# Patient Record
Sex: Male | Born: 1940 | Race: White | Hispanic: Yes | Smoking: Never smoker
Health system: Southern US, Community
[De-identification: ages and names within clinical notes are randomized; demographics above are authoritative.]

## PROBLEM LIST (undated history)

## (undated) DIAGNOSIS — J45909 Unspecified asthma, uncomplicated: Secondary | ICD-10-CM

## (undated) DIAGNOSIS — C801 Malignant (primary) neoplasm, unspecified: Secondary | ICD-10-CM

---

## 2008-12-22 DIAGNOSIS — D469 Myelodysplastic syndrome, unspecified: Secondary | ICD-10-CM | POA: Insufficient documentation

## 2012-06-16 DIAGNOSIS — J329 Chronic sinusitis, unspecified: Secondary | ICD-10-CM | POA: Insufficient documentation

## 2013-07-31 DIAGNOSIS — E291 Testicular hypofunction: Secondary | ICD-10-CM | POA: Insufficient documentation

## 2014-05-06 DIAGNOSIS — R05 Cough: Secondary | ICD-10-CM | POA: Insufficient documentation

## 2014-05-06 DIAGNOSIS — J181 Lobar pneumonia, unspecified organism: Secondary | ICD-10-CM | POA: Insufficient documentation

## 2014-05-06 DIAGNOSIS — R059 Cough, unspecified: Secondary | ICD-10-CM | POA: Insufficient documentation

## 2015-10-26 DIAGNOSIS — R030 Elevated blood-pressure reading, without diagnosis of hypertension: Secondary | ICD-10-CM | POA: Insufficient documentation

## 2016-12-04 DIAGNOSIS — E669 Obesity, unspecified: Secondary | ICD-10-CM | POA: Insufficient documentation

## 2017-03-27 DIAGNOSIS — Z9109 Other allergy status, other than to drugs and biological substances: Secondary | ICD-10-CM | POA: Insufficient documentation

## 2017-03-27 DIAGNOSIS — F5101 Primary insomnia: Secondary | ICD-10-CM | POA: Insufficient documentation

## 2017-03-27 DIAGNOSIS — R7989 Other specified abnormal findings of blood chemistry: Secondary | ICD-10-CM | POA: Insufficient documentation

## 2017-03-27 DIAGNOSIS — J410 Simple chronic bronchitis: Secondary | ICD-10-CM | POA: Insufficient documentation

## 2017-03-27 DIAGNOSIS — N529 Male erectile dysfunction, unspecified: Secondary | ICD-10-CM | POA: Insufficient documentation

## 2017-03-27 DIAGNOSIS — Z7189 Other specified counseling: Secondary | ICD-10-CM | POA: Insufficient documentation

## 2017-06-25 DIAGNOSIS — G252 Other specified forms of tremor: Secondary | ICD-10-CM | POA: Insufficient documentation

## 2017-07-10 DIAGNOSIS — N138 Other obstructive and reflux uropathy: Secondary | ICD-10-CM | POA: Insufficient documentation

## 2017-07-10 DIAGNOSIS — N401 Enlarged prostate with lower urinary tract symptoms: Secondary | ICD-10-CM | POA: Insufficient documentation

## 2017-11-01 DIAGNOSIS — J45909 Unspecified asthma, uncomplicated: Secondary | ICD-10-CM | POA: Insufficient documentation

## 2017-11-01 DIAGNOSIS — Z8669 Personal history of other diseases of the nervous system and sense organs: Secondary | ICD-10-CM | POA: Insufficient documentation

## 2017-11-01 DIAGNOSIS — Z9289 Personal history of other medical treatment: Secondary | ICD-10-CM | POA: Insufficient documentation

## 2018-01-11 DIAGNOSIS — M5440 Lumbago with sciatica, unspecified side: Secondary | ICD-10-CM | POA: Insufficient documentation

## 2018-03-14 DIAGNOSIS — Z9989 Dependence on other enabling machines and devices: Secondary | ICD-10-CM | POA: Insufficient documentation

## 2018-08-29 DIAGNOSIS — M25551 Pain in right hip: Secondary | ICD-10-CM | POA: Insufficient documentation

## 2018-08-29 DIAGNOSIS — M25552 Pain in left hip: Secondary | ICD-10-CM | POA: Insufficient documentation

## 2018-08-29 DIAGNOSIS — Z8619 Personal history of other infectious and parasitic diseases: Secondary | ICD-10-CM | POA: Insufficient documentation

## 2018-09-04 DIAGNOSIS — R5382 Chronic fatigue, unspecified: Secondary | ICD-10-CM | POA: Insufficient documentation

## 2018-09-09 ENCOUNTER — Other Ambulatory Visit (HOSPITAL_COMMUNITY): Payer: Self-pay | Admitting: Pulmonary Disease

## 2018-09-09 ENCOUNTER — Other Ambulatory Visit: Payer: Self-pay | Admitting: Pulmonary Disease

## 2018-09-09 DIAGNOSIS — R0602 Shortness of breath: Secondary | ICD-10-CM

## 2018-10-29 ENCOUNTER — Ambulatory Visit
Admission: RE | Admit: 2018-10-29 | Discharge: 2018-10-29 | Disposition: A | Discharge status: Home / Self Care | Payer: Medicare Other | Source: Ambulatory Visit | Attending: Pulmonary Disease | Admitting: Pulmonary Disease

## 2018-10-29 DIAGNOSIS — R0602 Shortness of breath: Secondary | ICD-10-CM | POA: Diagnosis not present

## 2018-10-29 HISTORY — DX: Malignant (primary) neoplasm, unspecified: C80.1

## 2018-10-29 HISTORY — DX: Unspecified asthma, uncomplicated: J45.909

## 2018-10-29 MED ORDER — IOHEXOL 300 MG/ML  SOLN
75.0000 mL | Freq: Once | INTRAMUSCULAR | Status: AC | PRN
  Administered 2018-10-29: 75 mL via INTRAVENOUS

## 2018-12-08 DIAGNOSIS — B351 Tinea unguium: Secondary | ICD-10-CM | POA: Insufficient documentation

## 2018-12-17 DIAGNOSIS — B009 Herpesviral infection, unspecified: Secondary | ICD-10-CM | POA: Insufficient documentation

## 2019-03-13 ENCOUNTER — Other Ambulatory Visit: Payer: Self-pay | Admitting: Family Medicine

## 2019-03-13 DIAGNOSIS — R1032 Left lower quadrant pain: Secondary | ICD-10-CM

## 2019-03-17 ENCOUNTER — Encounter (INDEPENDENT_AMBULATORY_CARE_PROVIDER_SITE_OTHER): Payer: Self-pay

## 2019-03-17 ENCOUNTER — Ambulatory Visit
Admission: RE | Admit: 2019-03-17 | Discharge: 2019-03-17 | Disposition: A | Discharge status: Home / Self Care | Payer: Medicare Other | Source: Ambulatory Visit | Attending: Family Medicine | Admitting: Family Medicine

## 2019-03-17 ENCOUNTER — Other Ambulatory Visit: Payer: Self-pay

## 2019-03-17 DIAGNOSIS — R1032 Left lower quadrant pain: Secondary | ICD-10-CM | POA: Insufficient documentation

## 2019-03-17 MED ORDER — IOHEXOL 300 MG/ML  SOLN
100.0000 mL | Freq: Once | INTRAMUSCULAR | Status: AC | PRN
  Administered 2019-03-17: 100 mL via INTRAVENOUS

## 2019-03-18 DIAGNOSIS — Z8719 Personal history of other diseases of the digestive system: Secondary | ICD-10-CM | POA: Insufficient documentation

## 2019-04-23 ENCOUNTER — Other Ambulatory Visit: Payer: Self-pay

## 2019-04-24 ENCOUNTER — Encounter: Payer: Self-pay | Admitting: Urology

## 2019-04-24 ENCOUNTER — Telehealth: Payer: Self-pay | Admitting: Urology

## 2019-04-24 ENCOUNTER — Ambulatory Visit (INDEPENDENT_AMBULATORY_CARE_PROVIDER_SITE_OTHER): Payer: Medicare Other | Admitting: Urology

## 2019-04-24 ENCOUNTER — Other Ambulatory Visit: Payer: Self-pay

## 2019-04-24 VITALS — BP 122/82 | HR 96 | Ht 71.0 in | Wt 215.0 lb

## 2019-04-24 DIAGNOSIS — N5201 Erectile dysfunction due to arterial insufficiency: Secondary | ICD-10-CM | POA: Diagnosis not present

## 2019-04-24 DIAGNOSIS — N5082 Scrotal pain: Secondary | ICD-10-CM | POA: Diagnosis not present

## 2019-04-24 DIAGNOSIS — E291 Testicular hypofunction: Secondary | ICD-10-CM | POA: Diagnosis not present

## 2019-04-24 DIAGNOSIS — N401 Enlarged prostate with lower urinary tract symptoms: Secondary | ICD-10-CM | POA: Diagnosis not present

## 2019-04-24 DIAGNOSIS — N5319 Other ejaculatory dysfunction: Secondary | ICD-10-CM

## 2019-04-24 NOTE — Telephone Encounter (Signed)
Dr. Bartolo Darter had his CT done at our [facility] location on 03/17/2019.   FYI  Angie

## 2019-04-24 NOTE — Progress Notes (Signed)
04/24/2019 2:54 PM   Vincent Hines 08/09/1941 [id number]  Referring provider: Dion Body, MD [facility] [place],  [place] [address]  Chief Complaint  Patient presents with  . Erectile Dysfunction  . Testicle Pain    HPI: Vincent Hines is a 78 y.o. male seen at the request of Dr. Netty Starring who presents today wanting to discuss several urologic issues.  He has a 110-month history of left hemiscrotal pain that radiates into the left lower quadrant.  He states he had a "stent" placed in his left vas deferens years ago.  He apparently underwent a testis biopsy for fertility and states that the problem was diagnosed as a side effect of antiseizure medication.  There are no identifiable precipitating, aggravating or alleviating factors to his pain.  Severity is rated mild to moderate.  He recently moved to the area from [place] and states he had "Roto-Rooter" with microwave on his prostate gland for lower urinary tract symptoms.  He noticed improvement in his voiding pattern.  He states for this last 6 months he does not have semen with ejaculation.  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  He is presently divorced and is not sexually active.  He also has hypogonadism and is followed by an endocrinologist at [facility].  He is on testosterone gel.   PMH: Past Medical History:  Diagnosis Date  . Asthma   . 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 INGUINAL HERNIA REPAIR Bilateral 2019  and another one 10 years ago  . prostate surgery 2019   Home Medications:  Allergies as of 04/24/2019      Reactions   Meperidine    Other reaction(s): Other (See Comments), Other (See Comments), Other (See Comments) Hypotension "irregular HR" reported by pt Low BP Hypotension Low BP   Shellfish Allergy Shortness Of Breath   Cerner Allergy Text Annotation: Shrimp "throat closing" per pt   Sulfamethoxazole-trimethoprim Anaphylaxis   Azithromycin    Other reaction(s): Dizziness      Medication List       Accurate as of April 24, 2019  2:54 PM. If you have any questions, ask your nurse or doctor.        albuterol (2.5 MG/3ML) 0.083% nebulizer solution Commonly known as: PROVENTIL VVN QID   bisacodyl 10 MG suppository Commonly known as: DULCOLAX   ciprofloxacin 500 MG tablet Commonly known as: CIPRO   fluticasone 50 MCG/ACT nasal spray Commonly known as: FLONASE SHAKE LQ AND U 1 SPR IEN QD   INSULIN SYRINGE .3CC/31GX5/16" 31G X 5/16" 0.3 ML Misc Use as needed with Trimix up to 3 times per week.   ipratropium 0.06 % nasal spray Commonly known as: ATROVENT U 2 SPRAYS IEN BID   metroNIDAZOLE 500 MG tablet Commonly known as: FLAGYL   montelukast 10 MG tablet Commonly known as: SINGULAIR montelukast 10 mg tablet  TAKE 1 TABLET BY MOUTH EVERY DAY   naproxen 500 MG tablet Commonly known as: NAPROSYN TK 1 T PO BID WC   Sodium Chloride (Inhalant) 7 % Nebu Inhale into the lungs.   tadalafil 10 MG tablet Commonly known as: CIALIS tadalafil 10 mg tablet  TK 1 T PO QD PRF SEXUAL ACTIVITY   terbinafine 250 MG tablet Commonly known as: LAMISIL TAKE 1 TABLET BY MOUTH ONCE DAILY FOR FUNGAL TOENAILS   Testosterone 12.5 MG/ACT (1%) Gel  Apply topically.   Tiotropium Bromide Monohydrate 2.5 MCG/ACT Aers Inhale into the lungs.   tobramycin (PF) 300 MG/5ML nebulizer solution Commonly known as: TOBI INHALE THE CONTENTS OF 1 AMPULE VIA NEBULIZER TWICE DAILY FOR 28 DAYS   traZODone 50 MG tablet Commonly known as: DESYREL   valACYclovir 1000 MG tablet Commonly known as: VALTREX   Viagra 100 MG tablet Generic drug: sildenafil Viagra 100 mg tablet       Allergies:  Allergies  Allergen Reactions  . Meperidine     Other reaction(s): Other (See Comments), Other (See Comments), Other (See Comments) Hypotension "irregular HR" reported by pt Low BP  Hypotension Low BP   . Shellfish Allergy Shortness Of Breath    Cerner Allergy Text Annotation: Shrimp "throat closing" per pt   . Sulfamethoxazole-Trimethoprim Anaphylaxis  . Azithromycin     Other reaction(s): Dizziness    Family History: No family history on file.  Social History:  reports that he has never smoked. He does not have any smokeless tobacco history on file. He reports current alcohol use. He reports that he does not use drugs.  ROS: UROLOGY Frequent Urination?: No Hard to postpone urination?: Yes Burning/pain with urination?: No Get up at night to urinate?: Yes Leakage of urine?: No Urine stream starts and stops?: No Trouble starting stream?: No Do you have to strain to urinate?: No Blood in urine?: No Urinary tract infection?: No Sexually transmitted disease?: No Injury to kidneys or bladder?: No Painful intercourse?: No Weak stream?: No Erection problems?: No Penile pain?: No  Gastrointestinal Nausea?: No Vomiting?: No Indigestion/heartburn?: No Diarrhea?: No Constipation?: No  Constitutional Fever: No Night sweats?: No Weight loss?: No Fatigue?: No  Skin Skin rash/lesions?: No Itching?: No  Eyes Blurred vision?: No Double vision?: No  Ears/Nose/Throat Sore throat?: No Sinus problems?: No  Hematologic/Lymphatic Swollen glands?: No Easy bruising?: No  Cardiovascular Leg swelling?: No Chest pain?: No  Respiratory Cough?: No Shortness of breath?: No  Endocrine Excessive thirst?: No  Musculoskeletal Back pain?: No Joint pain?: No  Neurological Headaches?: No Dizziness?: No  Psychologic Depression?: No Anxiety?: No  Physical Exam: BP 122/82 (BP Location: Left Arm, Patient Position: Sitting, Cuff Size: Normal)   Pulse 96   Ht 5\' 11"  (1.803 m)   Wt 215 lb (97.5 kg)   BMI 29.99 kg/m   Constitutional:  Alert and oriented, No acute distress. HEENT: [place] AT, moist mucus membranes.  Trachea midline, no masses.  Cardiovascular: No clubbing, cyanosis, or edema. Respiratory: Normal respiratory effort, no increased work of breathing. GI: Abdomen is soft, nontender, nondistended, no abdominal masses GU: Penis without lesions, testes descended bilaterally.  Soft and atrophic bilaterally without masses or tenderness.  No palpable inguinal hernia no inguinal canal tenderness. Lymph: No cervical or inguinal lymphadenopathy. Skin: No rashes, bruises or suspicious lesions. Neurologic: Grossly intact, no focal deficits, moving all 4 extremities. Psychiatric: Normal mood and affect.   Pertinent Imaging: CT abdomen pelvis July 2020 was reviewed  Assessment & Plan:    - Left scrotal content pain No significant abnormalities on exam.  Will order a scrotal ultrasound.  I did discuss that chronic scrotal pain can be difficult to diagnose an etiology and treatment can also be difficult.  He did have a CT of the abdomen and pelvis in July 2020.  There was a nonobstructing left renal calculus, a small renal cyst.  No significant pelvic abnormalities were identified.  -BPH with lower urinary tract symptoms We will obtain his prior urology  records for review.  His loss of ejaculation could be retrograde secondary to the prostate procedure.  I did discuss that this particular problem may not be able to be resolved.  - Erectile dysfunction Currently not sexually active.  He is on Trimix  - Hypogonadism Followed by endocrinology at [facility], [place] [address] [place], [place] [address] [phone]

## 2019-04-27 ENCOUNTER — Encounter: Payer: Self-pay | Admitting: Urology

## 2019-05-25 ENCOUNTER — Other Ambulatory Visit: Payer: Self-pay

## 2019-05-25 DIAGNOSIS — Z20822 Contact with and (suspected) exposure to covid-19: Secondary | ICD-10-CM

## 2019-05-27 LAB — NOVEL CORONAVIRUS, NAA: SARS-CoV-2, NAA: NOT DETECTED

## 2019-05-29 ENCOUNTER — Ambulatory Visit
Admission: RE | Admit: 2019-05-29 | Discharge: 2019-05-29 | Disposition: A | Discharge status: Home / Self Care | Payer: Medicare Other | Source: Ambulatory Visit | Attending: Urology | Admitting: Urology

## 2019-05-29 ENCOUNTER — Other Ambulatory Visit: Payer: Self-pay

## 2019-05-29 DIAGNOSIS — N5082 Scrotal pain: Secondary | ICD-10-CM

## 2019-06-01 ENCOUNTER — Telehealth: Payer: Self-pay

## 2019-06-01 NOTE — Telephone Encounter (Signed)
Called pt, informed him of the information below. Pt gave verbal understanding. He wanted me to make provider aware that he is still having the scrotal pain intermittently. Will forward to provider.

## 2019-06-01 NOTE — Telephone Encounter (Signed)
-----   Message from Abbie Sons, MD sent at 05/31/2019 12:57 PM EDT ----- Scrotal ultrasound showed no abnormalities.  No abnormalities identified that would be a cause of his scrotal pain.

## 2019-06-02 NOTE — Telephone Encounter (Signed)
Unfortunately scrotal pain can be a difficult problem to treat since in most cases the etiology is unknown.  Can see him back as needed for increasing pain.

## 2019-06-02 NOTE — Telephone Encounter (Signed)
Called pt informed him of the information below. Pt gave verbal understanding.  

## 2020-03-11 ENCOUNTER — Other Ambulatory Visit (HOSPITAL_COMMUNITY): Payer: Self-pay | Admitting: Pulmonary Disease

## 2020-03-11 ENCOUNTER — Other Ambulatory Visit: Payer: Self-pay | Admitting: Pulmonary Disease

## 2020-03-11 DIAGNOSIS — J471 Bronchiectasis with (acute) exacerbation: Secondary | ICD-10-CM

## 2020-04-01 ENCOUNTER — Telehealth: Payer: Self-pay

## 2020-04-01 DIAGNOSIS — N5201 Erectile dysfunction due to arterial insufficiency: Secondary | ICD-10-CM

## 2020-04-01 MED ORDER — NONFORMULARY OR COMPOUNDED ITEM: 6 refills | Status: AC

## 2020-04-01 NOTE — Telephone Encounter (Signed)
Can send in the lowest strength Trimix that [facility] has.  Start at 0.1 cc and can titrate as needed 0.05-0.1 cc to achieve an erection lasting 30-60 minutes.  Call or proceed to ED for erection lasting longer than 3 hours

## 2020-04-01 NOTE — Telephone Encounter (Signed)
Tri-mix comes in different concentrations.  Will need another concentration he was using.  Does he have any prior records or the pharmacy he was getting the Trimix from?

## 2020-04-01 NOTE — Telephone Encounter (Signed)
RX sent, pt informed

## 2020-04-01 NOTE — Telephone Encounter (Signed)
Dr. Bartolo Darter calls and states that his rx for Trimix is expired. He would like a new rx sent in. I do not see that we have ever sent this medication in for him, I called [facility] and they confirm that they have never filled Trimix for this pt. Please advise.

## 2020-04-01 NOTE — Telephone Encounter (Signed)
Called pt he does not remember what pharmacy he previously got medication at or the dose. He does remember that he titrated up by 0.05 to 0.10 as needed. Please advise.

## 2020-05-04 ENCOUNTER — Ambulatory Visit: Admission: RE | Admit: 2020-05-04 | Payer: Medicare Other | Source: Ambulatory Visit

## 2020-05-11 ENCOUNTER — Other Ambulatory Visit: Payer: Self-pay

## 2020-05-11 ENCOUNTER — Ambulatory Visit
Admission: RE | Admit: 2020-05-11 | Discharge: 2020-05-11 | Disposition: A | Discharge status: Home / Self Care | Payer: Medicare Other | Source: Ambulatory Visit | Attending: Pulmonary Disease | Admitting: Pulmonary Disease

## 2020-05-11 DIAGNOSIS — J471 Bronchiectasis with (acute) exacerbation: Secondary | ICD-10-CM | POA: Insufficient documentation

## 2020-06-21 ENCOUNTER — Other Ambulatory Visit: Payer: Self-pay | Admitting: Family Medicine

## 2020-06-21 ENCOUNTER — Other Ambulatory Visit (HOSPITAL_COMMUNITY): Payer: Self-pay | Admitting: Family Medicine

## 2020-06-21 DIAGNOSIS — M5440 Lumbago with sciatica, unspecified side: Secondary | ICD-10-CM

## 2020-07-06 ENCOUNTER — Ambulatory Visit
Admission: RE | Admit: 2020-07-06 | Discharge: 2020-07-06 | Disposition: A | Discharge status: Home / Self Care | Payer: Medicare Other | Source: Ambulatory Visit | Attending: Family Medicine | Admitting: Family Medicine

## 2020-07-06 ENCOUNTER — Other Ambulatory Visit: Payer: Self-pay

## 2020-07-06 DIAGNOSIS — M5440 Lumbago with sciatica, unspecified side: Secondary | ICD-10-CM | POA: Insufficient documentation

## 2020-11-17 ENCOUNTER — Other Ambulatory Visit: Payer: Self-pay | Admitting: Family Medicine

## 2020-11-17 ENCOUNTER — Other Ambulatory Visit (HOSPITAL_COMMUNITY): Payer: Self-pay | Admitting: Family Medicine

## 2020-11-17 DIAGNOSIS — R251 Tremor, unspecified: Secondary | ICD-10-CM

## 2020-11-17 DIAGNOSIS — R0789 Other chest pain: Secondary | ICD-10-CM

## 2020-11-17 DIAGNOSIS — R4781 Slurred speech: Secondary | ICD-10-CM

## 2020-11-30 ENCOUNTER — Other Ambulatory Visit: Payer: Self-pay

## 2020-11-30 ENCOUNTER — Ambulatory Visit
Admission: RE | Admit: 2020-11-30 | Discharge: 2020-11-30 | Disposition: A | Discharge status: Home / Self Care | Payer: Medicare Other | Source: Ambulatory Visit | Attending: Family Medicine | Admitting: Family Medicine

## 2020-11-30 DIAGNOSIS — R4781 Slurred speech: Secondary | ICD-10-CM | POA: Insufficient documentation

## 2020-11-30 DIAGNOSIS — R0789 Other chest pain: Secondary | ICD-10-CM | POA: Diagnosis present

## 2020-11-30 DIAGNOSIS — R251 Tremor, unspecified: Secondary | ICD-10-CM | POA: Insufficient documentation

## 2022-01-24 ENCOUNTER — Emergency Department: Payer: Medicare Other

## 2022-01-24 ENCOUNTER — Inpatient Hospital Stay
Admission: EM | Admit: 2022-01-24 | Discharge: 2022-01-26 | DRG: 178 | Disposition: A | Discharge status: Home / Self Care | Payer: Medicare Other | Attending: Internal Medicine | Admitting: Internal Medicine

## 2022-01-24 ENCOUNTER — Other Ambulatory Visit: Payer: Self-pay

## 2022-01-24 DIAGNOSIS — J690 Pneumonitis due to inhalation of food and vomit: Principal | ICD-10-CM | POA: Diagnosis present

## 2022-01-24 DIAGNOSIS — T446X5A Adverse effect of alpha-adrenoreceptor antagonists, initial encounter: Secondary | ICD-10-CM | POA: Diagnosis present

## 2022-01-24 DIAGNOSIS — N1831 Chronic kidney disease, stage 3a: Secondary | ICD-10-CM | POA: Diagnosis present

## 2022-01-24 DIAGNOSIS — R55 Syncope and collapse: Secondary | ICD-10-CM | POA: Diagnosis not present

## 2022-01-24 DIAGNOSIS — B009 Herpesviral infection, unspecified: Secondary | ICD-10-CM | POA: Diagnosis present

## 2022-01-24 DIAGNOSIS — J4520 Mild intermittent asthma, uncomplicated: Secondary | ICD-10-CM

## 2022-01-24 DIAGNOSIS — I5042 Chronic combined systolic (congestive) and diastolic (congestive) heart failure: Secondary | ICD-10-CM | POA: Diagnosis present

## 2022-01-24 DIAGNOSIS — D6959 Other secondary thrombocytopenia: Secondary | ICD-10-CM | POA: Diagnosis present

## 2022-01-24 DIAGNOSIS — D696 Thrombocytopenia, unspecified: Secondary | ICD-10-CM | POA: Diagnosis present

## 2022-01-24 DIAGNOSIS — Z20822 Contact with and (suspected) exposure to covid-19: Secondary | ICD-10-CM | POA: Diagnosis present

## 2022-01-24 DIAGNOSIS — J470 Bronchiectasis with acute lower respiratory infection: Secondary | ICD-10-CM | POA: Diagnosis present

## 2022-01-24 DIAGNOSIS — Z91013 Allergy to seafood: Secondary | ICD-10-CM

## 2022-01-24 DIAGNOSIS — D469 Myelodysplastic syndrome, unspecified: Secondary | ICD-10-CM | POA: Diagnosis present

## 2022-01-24 DIAGNOSIS — J9601 Acute respiratory failure with hypoxia: Secondary | ICD-10-CM

## 2022-01-24 DIAGNOSIS — E1142 Type 2 diabetes mellitus with diabetic polyneuropathy: Secondary | ICD-10-CM | POA: Diagnosis present

## 2022-01-24 DIAGNOSIS — C9111 Chronic lymphocytic leukemia of B-cell type in remission: Secondary | ICD-10-CM | POA: Diagnosis present

## 2022-01-24 DIAGNOSIS — N138 Other obstructive and reflux uropathy: Secondary | ICD-10-CM | POA: Diagnosis present

## 2022-01-24 DIAGNOSIS — N179 Acute kidney failure, unspecified: Secondary | ICD-10-CM | POA: Diagnosis not present

## 2022-01-24 DIAGNOSIS — Z79899 Other long term (current) drug therapy: Secondary | ICD-10-CM

## 2022-01-24 DIAGNOSIS — W19XXXA Unspecified fall, initial encounter: Secondary | ICD-10-CM

## 2022-01-24 DIAGNOSIS — I5032 Chronic diastolic (congestive) heart failure: Secondary | ICD-10-CM | POA: Diagnosis present

## 2022-01-24 DIAGNOSIS — J479 Bronchiectasis, uncomplicated: Secondary | ICD-10-CM

## 2022-01-24 DIAGNOSIS — J45909 Unspecified asthma, uncomplicated: Secondary | ICD-10-CM | POA: Diagnosis present

## 2022-01-24 DIAGNOSIS — E860 Dehydration: Secondary | ICD-10-CM | POA: Diagnosis present

## 2022-01-24 DIAGNOSIS — Z794 Long term (current) use of insulin: Secondary | ICD-10-CM

## 2022-01-24 DIAGNOSIS — Y92009 Unspecified place in unspecified non-institutional (private) residence as the place of occurrence of the external cause: Secondary | ICD-10-CM

## 2022-01-24 DIAGNOSIS — N401 Enlarged prostate with lower urinary tract symptoms: Secondary | ICD-10-CM | POA: Diagnosis present

## 2022-01-24 DIAGNOSIS — Z7951 Long term (current) use of inhaled steroids: Secondary | ICD-10-CM

## 2022-01-24 LAB — CBC WITH DIFFERENTIAL/PLATELET
Abs Immature Granulocytes: 0.03 10*3/uL (ref 0.00–0.07)
Basophils Absolute: 0 10*3/uL (ref 0.0–0.1)
Basophils Relative: 0 %
Eosinophils Absolute: 0.1 10*3/uL (ref 0.0–0.5)
Eosinophils Relative: 1 %
HCT: 43 % (ref 39.0–52.0)
Hemoglobin: 14.1 g/dL (ref 13.0–17.0)
Immature Granulocytes: 0 %
Lymphocytes Relative: 14 %
Lymphs Abs: 1.1 10*3/uL (ref 0.7–4.0)
MCH: 32.3 pg (ref 26.0–34.0)
MCHC: 32.8 g/dL (ref 30.0–36.0)
MCV: 98.6 fL (ref 80.0–100.0)
Monocytes Absolute: 0.3 10*3/uL (ref 0.1–1.0)
Monocytes Relative: 3 %
Neutro Abs: 6.8 10*3/uL (ref 1.7–7.7)
Neutrophils Relative %: 82 %
Platelets: 133 10*3/uL — ABNORMAL LOW (ref 150–400)
RBC: 4.36 MIL/uL (ref 4.22–5.81)
RDW: 13.4 % (ref 11.5–15.5)
WBC: 8.3 10*3/uL (ref 4.0–10.5)
nRBC: 0 % (ref 0.0–0.2)

## 2022-01-24 LAB — URINALYSIS, COMPLETE (UACMP) WITH MICROSCOPIC
Bacteria, UA: NONE SEEN
Bilirubin Urine: NEGATIVE
Glucose, UA: NEGATIVE mg/dL
Hgb urine dipstick: NEGATIVE
Ketones, ur: NEGATIVE mg/dL
Leukocytes,Ua: NEGATIVE
Nitrite: NEGATIVE
Protein, ur: NEGATIVE mg/dL
Specific Gravity, Urine: 1.038 — ABNORMAL HIGH (ref 1.005–1.030)
Squamous Epithelial / HPF: NONE SEEN (ref 0–5)
pH: 5 (ref 5.0–8.0)

## 2022-01-24 LAB — BASIC METABOLIC PANEL
Anion gap: 6 (ref 5–15)
BUN: 21 mg/dL (ref 8–23)
CO2: 30 mmol/L (ref 22–32)
Calcium: 9.2 mg/dL (ref 8.9–10.3)
Chloride: 103 mmol/L (ref 98–111)
Creatinine, Ser: 1.5 mg/dL — ABNORMAL HIGH (ref 0.61–1.24)
GFR, Estimated: 46 mL/min — ABNORMAL LOW (ref >60)
Glucose, Bld: 161 mg/dL — ABNORMAL HIGH (ref 70–99)
Potassium: 3.6 mmol/L (ref 3.5–5.1)
Sodium: 139 mmol/L (ref 135–145)

## 2022-01-24 LAB — BRAIN NATRIURETIC PEPTIDE: B Natriuretic Peptide: 21 pg/mL (ref 0.0–100.0)

## 2022-01-24 LAB — TROPONIN I (HIGH SENSITIVITY)
Troponin I (High Sensitivity): 9 ng/L (ref <18)
Troponin I (High Sensitivity): 8 ng/L (ref <18)

## 2022-01-24 LAB — TECHNOLOGIST SMEAR REVIEW: Plt Morphology: NORMAL

## 2022-01-24 LAB — LACTATE DEHYDROGENASE: LDH: 126 U/L (ref 98–192)

## 2022-01-24 LAB — SARS CORONAVIRUS 2 BY RT PCR: SARS Coronavirus 2 by RT PCR: NEGATIVE

## 2022-01-24 MED ORDER — LEVOFLOXACIN 500 MG PO TABS
250.0000 mg | ORAL_TABLET | ORAL | Status: DC
  Administered 2022-01-24: 250 mg via ORAL
  Filled 2022-01-24: qty 1

## 2022-01-24 MED ORDER — PANTOPRAZOLE SODIUM 40 MG PO TBEC
40.0000 mg | DELAYED_RELEASE_TABLET | Freq: Every day | ORAL | Status: DC
  Administered 2022-01-24 – 2022-01-26 (×3): 40 mg via ORAL
  Filled 2022-01-24 (×3): qty 1

## 2022-01-24 MED ORDER — PILOCARPINE HCL 5 MG PO TABS
5.0000 mg | ORAL_TABLET | Freq: Three times a day (TID) | ORAL | Status: DC
  Administered 2022-01-24 – 2022-01-26 (×7): 5 mg via ORAL
  Filled 2022-01-24 (×8): qty 1

## 2022-01-24 MED ORDER — FLUTICASONE PROPIONATE 50 MCG/ACT NA SUSP: 1.0000 | Freq: Every day | NASAL | Status: DC | PRN

## 2022-01-24 MED ORDER — ENOXAPARIN SODIUM 40 MG/0.4ML IJ SOSY
40.0000 mg | PREFILLED_SYRINGE | INTRAMUSCULAR | Status: DC
Start: 2022-01-24 — End: 2022-01-27
  Administered 2022-01-26: 40 mg via SUBCUTANEOUS
  Filled 2022-01-24 (×2): qty 0.4

## 2022-01-24 MED ORDER — TESTOSTERONE 1.62 % TD GEL: 4.0000 | Freq: Every day | TRANSDERMAL | Status: DC

## 2022-01-24 MED ORDER — METHYLPREDNISOLONE SODIUM SUCC 125 MG IJ SOLR
80.0000 mg | Freq: Once | INTRAMUSCULAR | Status: AC
  Administered 2022-01-24: 80 mg via INTRAVENOUS
  Filled 2022-01-24: qty 2

## 2022-01-24 MED ORDER — VALACYCLOVIR HCL 500 MG PO TABS
1000.0000 mg | ORAL_TABLET | Freq: Two times a day (BID) | ORAL | Status: DC
  Administered 2022-01-24 – 2022-01-26 (×5): 1000 mg via ORAL
  Filled 2022-01-24 (×6): qty 2

## 2022-01-24 MED ORDER — TRAZODONE HCL 50 MG PO TABS
150.0000 mg | ORAL_TABLET | Freq: Every evening | ORAL | Status: DC | PRN
  Administered 2022-01-24 – 2022-01-25 (×2): 150 mg via ORAL
  Filled 2022-01-24 (×2): qty 1

## 2022-01-24 MED ORDER — IPRATROPIUM-ALBUTEROL 0.5-2.5 (3) MG/3ML IN SOLN: 3.0000 mL | RESPIRATORY_TRACT | Status: DC

## 2022-01-24 MED ORDER — SODIUM CHLORIDE 0.9 % IV SOLN
3.0000 g | Freq: Four times a day (QID) | INTRAVENOUS | Status: DC
  Administered 2022-01-24 – 2022-01-26 (×8): 3 g via INTRAVENOUS
  Filled 2022-01-24: qty 8
  Filled 2022-01-24 (×4): qty 3
  Filled 2022-01-24: qty 8
  Filled 2022-01-24 (×2): qty 3
  Filled 2022-01-24: qty 8
  Filled 2022-01-24: qty 3

## 2022-01-24 MED ORDER — LIDOCAINE 5 % EX PTCH
1.0000 | MEDICATED_PATCH | CUTANEOUS | Status: DC
  Administered 2022-01-24 – 2022-01-25 (×2): 1 via TRANSDERMAL
  Filled 2022-01-24 (×2): qty 1

## 2022-01-24 MED ORDER — FLUTICASONE PROPIONATE 93 MCG/ACT NA EXHU: 1.0000 | INHALANT_SUSPENSION | Freq: Two times a day (BID) | NASAL | Status: DC

## 2022-01-24 MED ORDER — IPRATROPIUM-ALBUTEROL 0.5-2.5 (3) MG/3ML IN SOLN
3.0000 mL | Freq: Once | RESPIRATORY_TRACT | Status: AC
  Administered 2022-01-24: 3 mL via RESPIRATORY_TRACT
  Filled 2022-01-24: qty 3

## 2022-01-24 MED ORDER — ALBUTEROL SULFATE (2.5 MG/3ML) 0.083% IN NEBU: 2.5000 mg | INHALATION_SOLUTION | RESPIRATORY_TRACT | Status: DC | PRN

## 2022-01-24 MED ORDER — SODIUM CHLORIDE 0.9 % IV SOLN
3.0000 g | Freq: Once | INTRAVENOUS | Status: AC
  Administered 2022-01-24: 3 g via INTRAVENOUS
  Filled 2022-01-24: qty 8

## 2022-01-24 MED ORDER — IOHEXOL 350 MG/ML SOLN
75.0000 mL | Freq: Once | INTRAVENOUS | Status: AC | PRN
  Administered 2022-01-24: 75 mL via INTRAVENOUS

## 2022-01-24 MED ORDER — ONDANSETRON HCL 4 MG/2ML IJ SOLN: 4.0000 mg | Freq: Three times a day (TID) | INTRAMUSCULAR | Status: DC | PRN

## 2022-01-24 MED ORDER — BACLOFEN 10 MG PO TABS
20.0000 mg | ORAL_TABLET | Freq: Every day | ORAL | Status: DC
  Administered 2022-01-24 – 2022-01-25 (×2): 20 mg via ORAL
  Filled 2022-01-24 (×2): qty 2

## 2022-01-24 MED ORDER — GABAPENTIN 300 MG PO CAPS
600.0000 mg | ORAL_CAPSULE | Freq: Every day | ORAL | Status: DC
  Administered 2022-01-24 – 2022-01-25 (×2): 600 mg via ORAL
  Filled 2022-01-24 (×2): qty 2

## 2022-01-24 MED ORDER — LACTATED RINGERS IV BOLUS
1000.0000 mL | Freq: Once | INTRAVENOUS | Status: AC
  Administered 2022-01-24: 1000 mL via INTRAVENOUS

## 2022-01-24 MED ORDER — TAMSULOSIN HCL 0.4 MG PO CAPS
0.4000 mg | ORAL_CAPSULE | Freq: Every day | ORAL | Status: DC
  Administered 2022-01-24 – 2022-01-25 (×2): 0.4 mg via ORAL
  Filled 2022-01-24 (×3): qty 1

## 2022-01-24 MED ORDER — ACETAMINOPHEN 500 MG PO TABS
1000.0000 mg | ORAL_TABLET | Freq: Once | ORAL | Status: AC
  Administered 2022-01-24: 1000 mg via ORAL
  Filled 2022-01-24: qty 2

## 2022-01-24 MED ORDER — SODIUM CHLORIDE 0.9 % IV BOLUS
500.0000 mL | Freq: Once | INTRAVENOUS | Status: AC
  Administered 2022-01-24: 500 mL via INTRAVENOUS

## 2022-01-24 MED ORDER — DM-GUAIFENESIN ER 30-600 MG PO TB12: 1.0000 | ORAL_TABLET | Freq: Two times a day (BID) | ORAL | Status: DC | PRN

## 2022-01-24 MED ORDER — SODIUM CHLORIDE 0.9 % IV SOLN: INTRAVENOUS | Status: DC

## 2022-01-24 MED ORDER — HYDRALAZINE HCL 20 MG/ML IJ SOLN
5.0000 mg | INTRAMUSCULAR | Status: DC | PRN
Start: 2022-01-24 — End: 2022-01-27
  Administered 2022-01-26: 5 mg via INTRAVENOUS
  Filled 2022-01-24: qty 1

## 2022-01-24 MED ORDER — ACETAMINOPHEN 325 MG PO TABS
650.0000 mg | ORAL_TABLET | Freq: Four times a day (QID) | ORAL | Status: DC | PRN
  Administered 2022-01-24 – 2022-01-26 (×4): 650 mg via ORAL
  Filled 2022-01-24 (×4): qty 2

## 2022-01-24 MED ORDER — TOBRAMYCIN 300 MG/5ML IN NEBU
300.0000 mg | INHALATION_SOLUTION | Freq: Two times a day (BID) | RESPIRATORY_TRACT | Status: DC
  Administered 2022-01-24 – 2022-01-26 (×3): 300 mg via RESPIRATORY_TRACT
  Filled 2022-01-24 (×6): qty 5

## 2022-01-24 MED ORDER — IPRATROPIUM-ALBUTEROL 0.5-2.5 (3) MG/3ML IN SOLN
3.0000 mL | Freq: Three times a day (TID) | RESPIRATORY_TRACT | Status: DC
  Administered 2022-01-24 – 2022-01-26 (×5): 3 mL via RESPIRATORY_TRACT
  Filled 2022-01-24 (×5): qty 3

## 2022-01-24 MED ORDER — MONTELUKAST SODIUM 10 MG PO TABS
10.0000 mg | ORAL_TABLET | Freq: Every day | ORAL | Status: DC
  Administered 2022-01-24 – 2022-01-25 (×2): 10 mg via ORAL
  Filled 2022-01-24 (×2): qty 1

## 2022-01-24 MED ORDER — HYDROMORPHONE HCL 1 MG/ML IJ SOLN
INTRAMUSCULAR | Status: AC
  Filled 2022-01-24: qty 0.5

## 2022-01-24 NOTE — Assessment & Plan Note (Signed)
-   Bronchodilators -Patient is tobramycin nebulizer

## 2022-01-24 NOTE — Progress Notes (Signed)
Pharmacy Antibiotic Note  Vincent Hines is a 81 y.o. male admitted on 01/24/2022 with aspiration pneumonia.  Pharmacy has been consulted for unasyn dosing.  -hx CLL in remission -recent tx for PNA (augmentin 5/11-5/25/23 per pulmonolgy office visit note 01/11/22 in careeverywhere), also noted: hx Inhaled tobramycin 28 days on then off, and levaquin Mon,Wed,Fri  Plan: Unasyn 3 gm IV q8h   Height: '5\' 11"'$  (180.3 cm) Weight: 84.8 kg (187 lb) IBW/kg (Calculated) : 75.3  Temp (24hrs), Avg:97.7 F (36.5 C), Min:97.7 F (36.5 C), Max:97.7 F (36.5 C)  Recent Labs  Lab 01/24/22 0406  WBC 8.3  CREATININE 1.50*    Estimated Creatinine Clearance: 40.4 mL/min (A) (by C-G formula based on SCr of 1.5 mg/dL (H)).    Allergies  Allergen Reactions   Meperidine     Other reaction(s): Other (See Comments), Other (See Comments), Other (See Comments) Hypotension "irregular HR" reported by pt Low BP Hypotension Low BP    Shellfish Allergy Shortness Of Breath    Cerner Allergy Text Annotation: Shrimp "throat closing" per pt    Sulfamethoxazole-Trimethoprim Anaphylaxis   Azithromycin     Other reaction(s): Dizziness    Antimicrobials this admission: Unasyn  5/31 >>       >>    Dose adjustments this admission:    Microbiology results:   BCx:     UCx:      Sputum:      MRSA PCR:    Thank you for allowing pharmacy to be a part of this patient's care.  Tiye Huwe A 01/24/2022 8:17 AM

## 2022-01-24 NOTE — Assessment & Plan Note (Signed)
Continue Valtrex 

## 2022-01-24 NOTE — ED Triage Notes (Signed)
Pt comes from home via [facility] with complaints of a fall with LOC for about 2 minutes. Pt got up to use bathroom and fell backwards, hitting head/neck on wall. Pt endorses pain in neck area, 9/10. Pt not on blood thinners

## 2022-01-24 NOTE — ED Notes (Addendum)
Pt taken to xray 

## 2022-01-24 NOTE — Assessment & Plan Note (Signed)
Etiology is not clear.  Likely multifactorial etiology.  Patient states that he took CBD gummy.  Per his daughter, patient is not using his cane.  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  No unilateral neurodeficit on physical examination.  Low suspicions of stroke.  Patient denies any chest pain.  CT angiogram negative for PE.  Recent vitamin B12 level was above normal.  -Placed on telemetry bed for position -Frequent neurochecks -PT/OT -Echocardiogram demonstrated EF of 60-65%, essentially normal.  Syncope likely due to combination of dehydration and Flomax with consumption of CBD "gummies". The patient is urged to make sure he is drinking enough water. He is also referred to urology. He has BPH. It sounds as though he has previously been treated with TURP in [place]. He had relief for a while and is now symptomatic again. The flomax helps, but leads to orthostatic hypotension. A referral has been made to [facility] to see if he would be benefited by another procedure to improve his ability to pass urine without pharmaceutical assistance.

## 2022-01-24 NOTE — Evaluation (Signed)
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 No pertinent surgical history. HPI:  Pt is a 81 y.o. male with a history of CLL in remission, asthma, diabetes, bronchiectasis who presents from home for syncope.  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  Pt is followed by Pulmonology on a regular basis.  This admit, pt stated he went to bed last night.  Woke up this morning to go to the bathroom reaching to use his Cane.  He felt dizzy and syncopized.  Patient reports falling backwards with positive LOC which was witnessed by family member for about 2 minutes.  He is complaining of right-sided neck pain and a headache.  He is not on any blood thinners.  Patient reports that he was recently seen 20 days ago and diagnosed with pneumonia, has finished a course of antibiotics.  He denies chest pain or shortness of breath, abdominal pain, nausea or vomiting, fever or chills, diarrhea, dysuria or hematuria.  Patient denies low back pain or extremity pain.   CT of Chest: Multilobar pneumonia, as above. Given the basilar and dependent  distribution, along with the opacification of multiple bronchi and  areas of bronchiectasis.  Aortic atherosclerosis, in addition to two-vessel coronary artery  disease.  Small to moderate-sized hiatal hernia.    Assessment / Plan / Recommendation  Clinical  Impression   Pt seen for BSE this morning. He was awake, verbally conversive. A/O x3. Pleasant and engaged easily in conversation w/ this Clinician giving information re: his medical status to both MD(present briefly) and this Clinician. Pt adamently denied having any dysphagia.   Pt appears to present w/ adequate oropharyngeal phase swallow function w/ No oropharyngeal phase dysphagia noted, No neuromuscular deficits noted. Pt consumed po trials w/ No overt, clinical s/s of aspiration during po trials. Pt appears at reduced risk for aspiration following general aspiration precautions.   During po trials, pt consumed all consistencies w/ no overt coughing, decline in vocal quality, or change in respiratory presentation during/post trials. O2 sats remained 98%, RR 20-21. Pt tended to eat/drink quickly; education given on aspiration precautions including slowing down to lessen risk for choking. Pt agreed verbally. Oral phase appeared [facility] w/ timely bolus management, mastication, and control of bolus propulsion for A-P transfer for swallowing. Oral clearing achieved w/ all trial consistencies. OM Exam appeared [facility] w/ no unilateral weakness noted. Speech Clear. Pt fed self w/ min setup support(opened cartons).   Recommend a Regular consistency diet w/ well-Cut meats, moistened foods in setting of any Esophageal phase dysmotility(pt has a Hiatal Hernia per chart); Thin liquids. Recommend general aspiration precautions, general REFLUX precautions. Pills w/ water or Whole in Puree if easier swallowing. Education given on Pills in Puree; food consistencies and easy to eat options; general aspiration precautions. NSG to reconsult if any new needs arise. MD updated.  SLP Visit Diagnosis: Dysphagia, unspecified (R13.10)    Aspiration Risk   (reduced  following general aspiration precautions)    Diet Recommendation   Regular consistency diet w/ well-Cut meats, moistened foods in setting of any Esophageal phase  dysmotility(pt has a Hiatal Hernia per chart); Thin liquids. Recommend general aspiration precautions including Slowing Down when eating/drinking, general REFLUX precautions.  Medication Administration: Whole meds with liquid    Other  Recommendations Recommended Consults:  (continue to f/u w/ Pulmonology) Oral Care Recommendations: Oral care BID;Oral care before and after PO;Patient independent with oral care Other Recommendations:  (n/a)    Recommendations for follow up therapy are one component of a multi-disciplinary discharge planning process, led by the attending physician.  Recommendations may be updated based on patient status, additional functional criteria and insurance authorization.  Follow up Recommendations No SLP follow up      Assistance Recommended at Discharge None  Functional Status Assessment Patient has had a recent decline in their functional status and demonstrates the ability to make significant improvements in function in a reasonable and predictable amount of time.  Frequency and Duration  (n/a)   (n/a)       Prognosis Prognosis for Safe Diet Advancement: Good Barriers to Reach Goals:  (deconditioning)      Swallow Study   General Date of Onset: 01/24/22 HPI: Pt is a 81 y.o. male with a history of CLL in remission, asthma, diabetes, bronchiectasis who presents from home for syncope.  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  Pt is followed by Pulmonology on a regular basis.  This admit, pt stated he went to bed last night.  Woke up this morning to go to the bathroom reaching to use his Cane.  He felt dizzy and syncopized.  Patient reports falling backwards with positive LOC which was witnessed  by family member for about 2 minutes.  He is complaining of right-sided neck pain and a headache.  He is not on any blood thinners.  Patient reports that he was recently seen 20 days ago and diagnosed with pneumonia, has finished a course of antibiotics.  He denies chest pain or shortness of breath, abdominal pain, nausea or vomiting, fever or chills, diarrhea, dysuria or hematuria.  Patient denies low back pain or extremity pain.   CT of Chest: Multilobar pneumonia, as above. Given the basilar and dependent  distribution, along with the opacification of multiple bronchi and  areas of bronchiectasis.  Aortic atherosclerosis, in addition to two-vessel coronary artery  disease.  Small to moderate-sized hiatal hernia. Type of Study: Bedside Swallow Evaluation Previous Swallow Assessment: none Diet Prior to this Study: Regular;Thin liquids (at home) Temperature Spikes Noted: No (wbc 8.3) Respiratory Status: Nasal cannula (2L) History of Recent Intubation: No Behavior/Cognition: Alert;Cooperative;Pleasant mood Oral Cavity Assessment: Within Functional Limits Oral Care Completed by SLP: Recent completion by staff Oral Cavity - Dentition: Adequate natural dentition Vision: Functional for self-feeding Self-Feeding Abilities: Able to feed self;Needs set up (min) Patient Positioning: Upright in bed (supported correct positioning) Baseline Vocal Quality: Normal Volitional Cough: Strong;Congested (mildly) Volitional Swallow: Able to elicit    Oral/Motor/Sensory Function Overall Oral Motor/Sensory Function: Within functional limits   Ice Chips Ice chips: Within functional limits Presentation: Spoon (fed; 2)   Thin Liquid Thin Liquid: Within functional limits Presentation: Cup;Self Fed;Straw (~6+ ozs) Other Comments: water, juice    Nectar  Thick Nectar Thick Liquid: Not tested   Honey Thick Honey Thick Liquid: Not tested   Puree Puree: Within functional limits Presentation: Self Fed;Spoon (8 ozs)    Solid     Solid: Within functional limits Presentation: Self Fed (4 graham crackers) Other Comments: asked for breakfast meal         Orinda Kenner, MS, CCC-SLP Speech Language Pathologist Rehab Services; [place] [phone] (ascom) Cristino Degroff 01/24/2022,10:47 AM

## 2022-01-24 NOTE — Assessment & Plan Note (Signed)
Recent baseline creatinine 1.13 on 01/02/2022.  His creatinine is 1.50, BUN 21. -Hold naproxen -IV fluid as above -Avoid renal toxic medications

## 2022-01-24 NOTE — Assessment & Plan Note (Signed)
2D echo on 10/29/2013 showed EF of 45-50% with grade 1 diastolic dysfunction.  Patient does not have leg edema or JVD.  BNP 21.  CHF seem to be compensated. -Watch volume status closely

## 2022-01-24 NOTE — Assessment & Plan Note (Signed)
Platelet of 133.  Mental status normal. -Check LDH --> 126 normal -Check peripheral smear

## 2022-01-24 NOTE — H&P (Signed)
History and Physical    Vincent Hines:[id number] DOB: [birth year] DOA: 01/24/2022  Referring MD/NP/PA:   PCP: Dion Body, MD   Patient coming from:  The patient is coming from home.  At baseline, pt is independent for most of ADL.        Chief Complaint: syncope and cough  HPI: Vincent Hines is a 81 y.o. male with medical history significant of CLL in remission, monthly IVIG infusion in [facility], MDS, asthma, bronchiectasis, CHF with EF of 45-50%, CKD stage IIIa, intentional tremor, remote seizure, BPH, HSV-2 infection, who presents with cough and syncope.  Pt states that pt was in his usual state of health when he went to bed last night. He woke up this morning to go to the bathroom.  He felt dizzy and passed out for about 2 minutes.  Patient states that he took CBD gum yesterday evening.  He believes that CBD pill caused his syncope. Per his daughter, pt did not use his cane. He has numbness in both feet and toes. Patient reports falling backwards.  He injured his right side of neck and head.  He has right-sided neck pain.  Patient does not have unilateral numbness or tinglings in extremities.  No facial droop or slurred speech. He reports cough with yellow-colored sputum production, mild shortness breath.  Denies chest pain, fever or chills.  Patient has nausea, no vomiting, diarrhea or abdominal pain.  No symptoms of UTI.  Patient was found to have oxygen desaturation to 88% on room air, which improved to 96-100% on 2 L oxygen.  Patient is normally not using oxygen.  Patient states that she recently completed a course of Augmentin for pneumonia approximately 20 days ago.  Data Reviewed and ED Course: pt was found to have WBC 8.3, troponin level 9, BNP 21, worsening renal function with creatinine 1.50, BUN 21 (baseline creatinine 1.13 on 01/02/2022), temperature normal, blood pressure 140/76, heart rate 78, RR 20, chest x-ray showed low volume and bilateral basilar atelectasis.  X-ray of the T-spine  negative.  CT head negative.  CT of the C-spine is negative for acute injury, but showed degenerative disc disease.  CT angiogram is negative for PE, showed multifocal infiltration for possible aspiration pneumonia.  Patient is placed on telemetry bed for observation.  EKG: I have personally reviewed.  Sinus rhythm, QTc 468, bifascicular block, poor R wave progression   Review of Systems:   General: no fevers, chills, no body weight gain, has fatigue HEENT: no blurry vision, hearing changes or sore throat Respiratory: has dyspnea, coughing, no wheezing CV: no chest pain, no palpitations GI: has nausea, no vomiting, abdominal pain, diarrhea, constipation GU: no dysuria, burning on urination, increased urinary frequency, hematuria  Ext: no leg edema Neuro: no unilateral weakness, numbness, or tingling, no vision change or hearing loss. Has syncope Skin: no rash, no skin tear. MSK: No muscle spasm, no deformity, no limitation of range of movement in spin Heme: No easy bruising.  Travel history: No recent long distant travel.   Allergy:  Allergies  Allergen Reactions   Meperidine     Other reaction(s): Other (See Comments), Other (See Comments), Other (See Comments) Hypotension "irregular HR" reported by pt Low BP Hypotension Low BP    Shellfish Allergy Shortness Of Breath    Cerner Allergy Text Annotation: Shrimp "throat closing" per pt    Sulfamethoxazole-Trimethoprim Anaphylaxis   Azithromycin     Other reaction(s): Dizziness   Lisinopril Other (See Comments)    Frequent  urination Other reaction(s): Other (See Comments) Frequent urination     Past Medical History:  Diagnosis Date   Asthma    Cancer ([place])    CLL    History reviewed. No pertinent surgical history.  Social History:  reports that he has never smoked. He does not have any smokeless tobacco history on file. He reports current alcohol use. He reports that he does not use drugs.  Family History:   Family History  Problem Relation Age of Onset   Rheum arthritis Mother    Lung cancer Father      Prior to Admission medications   Medication Sig Start Date End Date Taking? Authorizing Provider  albuterol (PROVENTIL) (2.5 MG/3ML) 0.083% nebulizer solution VVN QID 01/25/19   [provider]  bisacodyl (DULCOLAX) 10 MG suppository  12/03/18   [provider]  ciprofloxacin (CIPRO) 500 MG tablet  04/13/19   [provider]  fluticasone (FLONASE) 50 MCG/ACT nasal spray SHAKE LQ AND U 1 SPR IEN QD 01/07/19   [provider]  Insulin Syringe-Needle U-100 (INSULIN SYRINGE .3CC/31GX5/16") 31G X 5/16" 0.3 ML MISC Use as needed with Trimix up to 3 times per week. 09/04/18   [provider]  ipratropium (ATROVENT) 0.06 % nasal spray U 2 SPRAYS IEN BID 03/08/19   [provider]  metroNIDAZOLE (FLAGYL) 500 MG tablet  04/13/19   [provider]  montelukast (SINGULAIR) 10 MG tablet montelukast 10 mg tablet  TAKE 1 TABLET BY MOUTH EVERY DAY 12/25/17   [provider]  naproxen (NAPROSYN) 500 MG tablet TK 1 T PO BID WC 04/16/19   [provider]  NONFORMULARY OR COMPOUNDED ITEM Trimix (30/1/10)-(Pap/Phent/PGE)  Dosage: Inject 0.01 cc per injection. Can titrate 0.05-0.1cc to achieve erection lasting 30-63mns   Vial 159m Qty #10 Refills 6  [phone]/02/15   StAbbie SonsMD  sildenafil (VIAGRA) 100 MG tablet Viagra 100 mg tablet    [provider]  tadalafil (CIALIS) 10 MG tablet tadalafil 10 mg tablet  TK 1 T PO QD PRF SEXUAL ACTIVITY 11/10/14   [provider]  terbinafine (LAMISIL) 250 MG tablet TAKE 1 TABLET BY MOUTH ONCE DAILY FOR FUNGAL TOENAILS 03/02/19   [provider]  Testosterone 12.5 MG/ACT (1%) GEL Apply topically. 09/06/18   [provider]  Tiotropium Bromide Monohydrate 2.5 MCG/ACT AERS Inhale into the lungs.    [provider]  tobramycin, PF, (TOBI) 300 MG/5ML nebulizer solution INHALE THE CONTENTS OF 1 AMPULE VIA NEBULIZER TWICE DAILY FOR 28 DAYS 12/02/18   [provider]  traZODone (DESYREL) 50 MG tablet  10/27/18   [provider]  valACYclovir (VALTREX) 1000 MG tablet  12/19/18   [provider]    Physical Exam: Vitals:   01/24/22 1206 01/24/22 1210 01/24/22 1254 01/24/22 1735  BP: (!) 96/59  (!) 106/54 112/63  Pulse: 91  74 82  Resp:      Temp:    99.3 F (37.4 C)  TempSrc:    Oral  SpO2: 95% 94% 95% 92%  Weight:      Height:       General: Not in acute distress.  Has dry mucous membrane HEENT:       Eyes: PERRL, EOMI, no scleral icterus.       ENT: No discharge from the ears and nose, no pharynx injection, no tonsillar enlargement.        Neck: No JVD, no  bruit, no mass felt. Heme: No neck lymph node enlargement. Cardiac: S1/S2, RRR, No murmurs, No gallops or rubs. Respiratory: Has rhonchi bilaterally GI: Soft, nondistended, nontender, no rebound pain, no organomegaly, BS present. GU: No hematuria Ext: No pitting leg edema bilaterally. 1+DP/PT pulse bilaterally. Musculoskeletal: No joint deformities, No joint redness or warmth, no limitation of ROM in spin. Skin: No rashes.  Neuro: Alert, oriented X3, cranial nerves II-XII grossly intact, moves all extremities normally. Muscle strength 5/5 in all extremities, sensation to light touch intact.  Psych: Patient is not psychotic, no suicidal or hemocidal ideation.  Labs on Admission: I have personally reviewed following labs and imaging studies  CBC: Recent Labs  Lab 01/24/22 0406  WBC 8.3  NEUTROABS 6.8  HGB 14.1  HCT 43.0  MCV 98.6  PLT 878*   Basic Metabolic Panel: Recent Labs  Lab 01/24/22 0406  NA 139  K 3.6  CL 103  CO2 30  GLUCOSE 161*  BUN 21  CREATININE 1.50*  CALCIUM 9.2   GFR: Estimated Creatinine Clearance: 40.4 mL/min (A) (by C-G formula based on SCr of 1.5 mg/dL (H)). Liver Function  Tests: No results for input(s): AST, ALT, ALKPHOS, BILITOT, PROT, ALBUMIN in the last 168 hours. No results for input(s): LIPASE, AMYLASE in the last 168 hours. No results for input(s): AMMONIA in the last 168 hours. Coagulation Profile: No results for input(s): INR, PROTIME in the last 168 hours. Cardiac Enzymes: No results for input(s): CKTOTAL, CKMB, CKMBINDEX, TROPONINI in the last 168 hours. BNP (last 3 results) No results for input(s): PROBNP in the last 8760 hours. HbA1C: No results for input(s): HGBA1C in the last 72 hours. CBG: No results for input(s): GLUCAP in the last 168 hours. Lipid Profile: No results for input(s): CHOL, HDL, LDLCALC, TRIG, CHOLHDL, LDLDIRECT in the last 72 hours. Thyroid Function Tests: No results for input(s): TSH, T4TOTAL, FREET4, T3FREE, THYROIDAB in the last 72 hours. Anemia Panel: No results for input(s): VITAMINB12, FOLATE, FERRITIN, TIBC, IRON, RETICCTPCT in the last 72 hours. Urine analysis:    Component Value Date/Time   COLORURINE YELLOW (A) 01/24/2022 1215   APPEARANCEUR CLEAR (A) 01/24/2022 1215   LABSPEC 1.038 (H) 01/24/2022 1215   PHURINE 5.0 01/24/2022 1215   GLUCOSEU NEGATIVE 01/24/2022 1215   HGBUR NEGATIVE 01/24/2022 1215   [place] 01/24/2022 1215   KETONESUR NEGATIVE 01/24/2022 1215   PROTEINUR NEGATIVE 01/24/2022 1215   NITRITE NEGATIVE 01/24/2022 1215   LEUKOCYTESUR NEGATIVE 01/24/2022 1215   Sepsis Labs: '@LABRCNTIP'$ (procalcitonin:4,lacticidven:4) ) Recent Results (from the past 240 hour(s))  SARS Coronavirus 2 by RT PCR (hospital order, performed in [place] hospital lab) *cepheid single result test* Anterior Nasal Swab     Status: None   Collection Time: 01/24/22 12:15 PM   Specimen: Anterior Nasal Swab  Result Value Ref Range Status   SARS Coronavirus 2 by RT PCR NEGATIVE NEGATIVE Final    Comment: (NOTE) SARS-CoV-2 target nucleic acids are NOT DETECTED.  The SARS-CoV-2 RNA is generally detectable  in upper and lower respiratory specimens during the acute phase of infection. The lowest concentration of SARS-CoV-2 viral copies this assay can detect is 250 copies / mL. A negative result does not preclude SARS-CoV-2 infection and should not be used as the sole basis for treatment or other patient management decisions.  A negative result may occur with improper specimen collection / handling, submission of specimen other than nasopharyngeal swab, presence of viral mutation(s) within the areas targeted by this assay, and inadequate  number of viral copies (<250 copies / mL). A negative result must be combined with clinical observations, patient history, and epidemiological information.  Fact Sheet for Patients:   https://www.patel.info/  Fact Sheet for Healthcare Providers: https://hall.com/  This test is not yet approved or  cleared by the [place] FDA and has been authorized for detection and/or diagnosis of SARS-CoV-2 by FDA under an Emergency Use Authorization (EUA).  This EUA will remain in effect (meaning this test can be used) for the duration of the COVID-19 declaration under Section 564(b)(1) of the Act, 21 U.S.C. section 360bbb-3(b)(1), unless the authorization is terminated or revoked sooner.  Performed at [facility], [address] [place], [place] [address]      Radiological Exams on Admission: DG Chest 2 View  Result Date: 01/24/2022 CLINICAL DATA:  81 year old male status post fall backwards at home. Pain. EXAM: CHEST - 2 VIEW COMPARISON:  Chest CT 05/11/2020. FINDINGS: Seated AP and lateral views of the chest at 0442 hours. Lower lung volumes. Stable cardiac size and mediastinal contours. No definite cardiomegaly. Visualized tracheal air column is within normal limits. Azygous fissure as seen by CT. No pneumothorax, pulmonary edema, pleural effusion or consolidation. Patchy bibasilar atelectasis. Osteopenia.  No acute osseous abnormality identified. Paucity of bowel gas in the upper abdomen. IMPRESSION: Low lung volumes with bibasilar atelectasis. Electronically Signed   By: Genevie Ann M.D.   On: 01/24/2022 04:58   DG Thoracic Spine 2 View  Result Date: 01/24/2022 CLINICAL DATA:  81 year old male status post fall backwards at home. Pain. EXAM: THORACIC SPINE 2 VIEWS COMPARISON:  Chest radiographs today. Cervical spine CT today. Chest CT 05/11/2020. FINDINGS: Normal thoracic segmentation on the comparison CT. Cervicothoracic junction alignment better demonstrated on the cervical spine CT today. Mild chronic levoconvex lower thoracic scoliosis is stable. Chronic interbody ankylosis T9 through T11 or T12 from flowing endplate osteophytes. Stable thoracic vertebral height and alignment since 2021. Relatively preserved disc spaces. Negative visible chest and abdominal visceral contours. IMPRESSION: No acute osseous abnormality identified in the thoracic spine. Electronically Signed   By: Genevie Ann M.D.   On: 01/24/2022 05:01   CT Head Wo Contrast  Result Date: 01/24/2022 CLINICAL DATA:  81 year old male status post fall backwards, striking head and neck on wall with loss of consciousness. Neck pain. EXAM: CT HEAD WITHOUT CONTRAST TECHNIQUE: Contiguous axial images were obtained from the base of the skull through the vertex without intravenous contrast. RADIATION DOSE REDUCTION: This exam was performed according to the departmental dose-optimization program which includes automated exposure control, adjustment of the mA and/or kV according to patient size and/or use of iterative reconstruction technique. COMPARISON:  Brain MRI 11/30/2020. FINDINGS: Brain: Cerebral volume is within normal limits for age. No midline shift, ventriculomegaly, mass effect, evidence of mass lesion, intracranial hemorrhage or evidence of cortically based acute infarction. Gray-white matter differentiation within normal limits for age throughout  the brain, stable to the MRI last month. Vascular: Mild Calcified atherosclerosis at the skull base. No suspicious intracranial vascular hyperdensity. Skull: No skull fracture identified. Sinuses/Orbits: Previous paranasal sinus surgery. Mild to moderate maxillary sinus mucosal thickening has not significantly changed. No acute sinus fluid level. Tympanic cavities and mastoids are well aerated. Other: No discrete scalp hematoma. Postoperative changes to both globes, otherwise negative orbits soft tissues. IMPRESSION: No acute traumatic injury to the head. Normal for age non contrast CT appearance of the brain. Electronically Signed   By: Genevie Ann M.D.   On: 01/24/2022 04:46  CT Angio Chest PE W and/or Wo Contrast  Result Date: 01/24/2022 CLINICAL DATA:  81 year old male with history of fall with loss of consciousness for approximately 2 minutes. Evaluate for pulmonary embolism. EXAM: CT ANGIOGRAPHY CHEST WITH CONTRAST TECHNIQUE: Multidetector CT imaging of the chest was performed using the standard protocol during bolus administration of intravenous contrast. Multiplanar CT image reconstructions and MIPs were obtained to evaluate the vascular anatomy. RADIATION DOSE REDUCTION: This exam was performed according to the departmental dose-optimization program which includes automated exposure control, adjustment of the mA and/or kV according to patient size and/or use of iterative reconstruction technique. CONTRAST:  44m OMNIPAQUE IOHEXOL 350 MG/ML SOLN COMPARISON:  Chest CT 05/11/2020. FINDINGS: Cardiovascular: There are no filling defects within the pulmonary arterial tree to suggest pulmonary embolism. Heart size is normal. There is no significant pericardial fluid, thickening or pericardial calcification. There is aortic atherosclerosis, as well as atherosclerosis of the great vessels of the mediastinum and the coronary arteries, including calcified atherosclerotic plaque in the left anterior descending and  right coronary arteries. Mediastinum/Nodes: No pathologically enlarged mediastinal or hilar lymph nodes. Small to moderate hiatal hernia. No axillary lymphadenopathy. Lungs/Pleura: Patchy areas of ground-glass attenuation, thickening of the peribronchovascular interstitium and peribronchovascular airspace consolidation noted in the lung bases bilaterally, most severe in the left lower lobe where there is extensive opacification of bronchi, and some cylindrical bronchiectasis. No pleural effusions. Azygous lobe (normal anatomical variant) incidentally noted. Upper Abdomen: Unremarkable. Musculoskeletal: There are no aggressive appearing lytic or blastic lesions noted in the visualized portions of the skeleton. Review of the MIP images confirms the above findings. IMPRESSION: 1. No evidence of pulmonary embolism. 2. Multilobar pneumonia, as above. Given the basilar and dependent distribution, along with the opacification of multiple bronchi and areas of bronchiectasis, clinical correlation for signs and symptoms of recurrent aspiration is recommended. Speech therapy swallow study should be considered. 3. Aortic atherosclerosis, in addition to two-vessel coronary artery disease. 4. Small to moderate-sized hiatal hernia. Aortic Atherosclerosis (ICD10-I70.0). Electronically Signed   By: DVinnie LangtonM.D.   On: 01/24/2022 06:24   CT Cervical Spine Wo Contrast  Result Date: 01/24/2022 CLINICAL DATA:  81year old male status post fall backwards, striking head and neck on wall with loss of consciousness. Neck pain. EXAM: CT CERVICAL SPINE WITHOUT CONTRAST TECHNIQUE: Multidetector CT imaging of the cervical spine was performed without intravenous contrast. Multiplanar CT image reconstructions were also generated. RADIATION DOSE REDUCTION: This exam was performed according to the departmental dose-optimization program which includes automated exposure control, adjustment of the mA and/or kV according to patient size  and/or use of iterative reconstruction technique. COMPARISON:  Head CT today reported separately. Brain MRI 11/30/2020. FINDINGS: Alignment: Straightening of cervical lordosis. Mild anterolisthesis of C7 on T1 appears to be degenerative in nature. Subtle similar anterolisthesis of C3 on C4. Skull base and vertebrae: Visualized skull base is intact. No atlanto-occipital dissociation. C1 and C2 appear intact and aligned, with advanced degeneration described below. Small ossific fragments lateral to the right C1-C2 articulation (coronal image 24) do not appear acute. No acute osseous abnormality identified. Soft tissues and spinal canal: No prevertebral fluid or swelling. No visible canal hematoma. Negative visible noncontrast neck soft tissues. Disc levels: Advanced anterior C1-C2 joint space loss with bulky osteophytosis and probable developing ankylosis. Widespread bilateral cervical facet hypertrophy. Advanced disc and endplate degeneration CZ3-Y8through C6-C7. Subsequent mild degenerative spinal stenosis suspected at C4-C5. Upper chest: Mild degenerative appearing anterolisthesis of T1 on T2. Grossly intact visible upper  thoracic vertebrae. Lung apices are negative; right side azygous fissure (normal variant). Negative noncontrast thoracic inlet soft tissues. IMPRESSION: 1. No acute traumatic injury identified in the cervical spine. 2. 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 Electronically Signed   By: Genevie Ann M.D.   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[place])   Acute renal failure superimposed on stage 3a chronic kidney disease (HCC)   Chronic combined systolic and diastolic CHF (congestive heart failure) (HCC)   HSV-2  infection   Thrombocytopenia (HCC)   Principal Problem:   Syncope Active Problems:   Fall at home, initial encounter   Aspiration pneumonia ([place])   Asthma   Bronchiectasis ([place])   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  Likely multifactorial etiology.  Patient states that he took CBD gummy.  Per his daughter, patient is not using his cane.  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  No unilateral neurodeficit on physical examination.  Low suspicions of stroke.  Patient denies any chest pain.  CT angiogram negative for PE.  Recent vitamin B12 level was above normal.  -Placed on telemetry bed for position -Frequent neurochecks -PT/OT -Check orthostatic vital sign -IV fluid: 1 L LR, then 75 cc/h of normal saline  Fall at home, initial encounter -fall precaution -PT/OT  Aspiration pneumonia ([place]) Possible aspiration pneumonia.  Patient has oxygen desaturation to 88%, has 2 L new oxygen requirement. 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  WBC 8.3.  Patient is receiving monthly IVIG in [facility].  Next dose will be on 01/30/2022.  Acute renal failure superimposed on stage 3a chronic kidney disease ([place]) Recent baseline creatinine 1.13 on 01/02/2022.  His creatinine is 1.50, BUN 21. -Hold naproxen -IV fluid as above -Avoid renal toxic medications  Chronic combined systolic and diastolic CHF (congestive heart failure) (HCC) 2D echo on 10/29/2013 showed EF of 45-50% with grade 1 diastolic dysfunction.  Patient does  not have leg edema or JVD.  BNP 21.  CHF seem to be compensated. -Watch volume status closely  HSV-2 infection - Continue Valtrex  Thrombocytopenia (HCC) Platelet of 133.  Mental status normal. -Check LDH --> 126 normal -Check peripheral smear             DVT ppx:    SQ Lovenox  Code Status: Full code  Family Communication:    Yes, patient's  daughter by phone  Disposition Plan:  Anticipate discharge back to previous environment  Consults called:  none  Admission status and  Level of care: Telemetry Medical:    for obs         Severity of Illness:  The appropriate patient status for this patient is OBSERVATION. Observation status is judged to be reasonable and necessary in order to provide the required intensity of service to ensure the patient's safety.  The patient's presenting symptoms, physical exam findings, and initial radiographic and laboratory data in the context of their medical condition is felt to place them at decreased risk for further clinical deterioration. Furthermore, it is anticipated that the patient will be medically stable for discharge from the hospital within 2 midnights of admission.        Date of Service 01/24/2022    [facility] [facility]   If 7PM-7AM, please contact night-coverage [email or web address] 01/24/2022, 7:03 PM

## 2022-01-24 NOTE — ED Notes (Signed)
Pt O2 sat 88% RA, placed on 2L [place]. O2 Sat now 96%

## 2022-01-24 NOTE — Assessment & Plan Note (Signed)
-   Stable.  WBC 8.3.  Patient is receiving monthly IVIG in [facility].  Next dose will be on 01/30/2022.

## 2022-01-24 NOTE — Progress Notes (Signed)
Admission profile updated. ?

## 2022-01-24 NOTE — Evaluation (Signed)
Physical Therapy Evaluation Patient Details Name: Kunaal Walkins MRN: [id number] DOB: 1941/04/05 Today's Date: 01/24/2022  History of Present Illness  Detrell Umscheid is a 81 y.o. male with a history of CLL in remission, asthma, diabetes, bronchiectasis who presents from home for syncope.  Patient reports being in his usual state of health when he went to bed last night.  Woke up this morning to go to the bathroom.  He felt dizzy and syncopized.  Patient reports falling backwards with positive LOC which was witnessed by family member for about 2 minutes. MD assessment includes syncope, aspiration pneumonia, and respiratory hypoxia.   Clinical Impression  Pt was pleasant and put good effort throughout session. Pt presented with decreased strength and required extra time and effort to perform bed mobility and transfers. Pt presented with decreased safety awareness with turning during ambulation and requiring min cuing to maintain walker within safe BOS. Pt will benefit from continued skilled PT while in acute care to prevent functional decline. Pt is expected to make very good progress towards goals while in acute care, however, will not require continued PT services upon discharge.        Recommendations for follow up therapy are one component of a multi-disciplinary discharge planning process, led by the attending physician.  Recommendations may be updated based on patient status, additional functional criteria and insurance authorization.  Follow Up Recommendations No PT follow up    Assistance Recommended at Discharge Intermittent Supervision/Assistance  Patient can return home with the following  A little help with walking and/or transfers;A little help with bathing/dressing/bathroom    Equipment Recommendations Rolling walker (2 wheels)  Recommendations for Other Services       Functional Status Assessment Patient has had a recent decline in their functional status and demonstrates the ability to  make significant improvements in function in a reasonable and predictable amount of time.     Precautions / Restrictions Precautions Precautions: Fall Restrictions Weight Bearing Restrictions: No      Mobility  Bed Mobility Overal bed mobility: Needs Assistance Bed Mobility: Supine to Sit     Supine to sit: Min assist     General bed mobility comments: Required extra time and effort to perform supine to sit as well as min A for trunk control.    Transfers Overall transfer level: Needs assistance Equipment used: Rolling walker (2 wheels) Transfers: Sit to/from Stand Sit to Stand: Min guard Min verbal cues for hand placement                Ambulation/Gait Ambulation/Gait assistance: Min guard Gait Distance (Feet): 150 Feet x2 Assistive device: Rolling walker (2 wheels) Gait Pattern/deviations: Step-through pattern, Decreased step length - right, Decreased step length - left       General Gait Details: Pt required min cuing to maintain walker close to BOS during ambulation  Stairs Stairs: Yes Stairs assistance: Min guard Stair Management: One rail Left Number of Stairs: 4 General stair comments: Required min cuing for squencing  Wheelchair Mobility    Modified Rankin (Stroke Patients Only)       Balance Overall balance assessment: Needs Assistance, History of Falls   Sitting balance-Leahy Scale: Good     Standing balance support: Bilateral upper extremity supported Standing balance-Leahy Scale: Good                               Pertinent Vitals/Pain Pain Assessment Pain Assessment: 0-10 Pain Score:  8  Pain Descriptors / Indicators: Aching Pain Intervention(s): Premedicated before session, Monitored during session    Home Living Family/patient expects to be discharged to:: Private residence Living Arrangements: Children, [place] lives with daughter Available Help at Discharge: Family;Available 24 hours/day Type of Home: House Home  Access: Stairs to enter Entrance Stairs-Rails: Left Entrance Stairs-Number of Steps: 2 steps to enter   Home Layout: One level Home Equipment: Cane - single point      Prior Function Prior Level of Function : Independent/Modified Independent             Mobility Comments: Independent ambulation 85% using no AD, 15% using SPC especially at night or for longer distances. No fall history other than admitting fall. Independent with ADLs.        Hand Dominance        Extremity/Trunk Assessment   Upper Extremity Assessment Upper Extremity Assessment: Overall WFL for tasks assessed    Lower Extremity Assessment Lower Extremity Assessment: Generalized weakness;Overall [facility] for tasks assessed    Cervical / Trunk Assessment Cervical / Trunk Assessment: Other exceptions Cervical / Trunk Exceptions: Slight foward head posture  Communication   Communication: No difficulties  Cognition Arousal/Alertness: Awake/alert Behavior During Therapy: WFL for tasks assessed/performed Overall Cognitive Status: Within Functional Limits for tasks assessed                                          General Comments      Exercises Other Exercises Other Exercises: Pt educated on the physiologic benefits of sitting upright in chair   Assessment/Plan    PT Assessment Patient needs continued PT services  PT Problem List Decreased strength;Decreased activity tolerance;Decreased balance;Decreased mobility       PT Treatment Interventions Gait training;Stair training;Functional mobility training;Therapeutic activities;Therapeutic exercise;Balance training;Patient/family education    PT Goals (Current goals can be found in the Care Plan section)  Acute Rehab PT Goals Patient Stated Goal: Pt stated they would like increase overall strength and balance to return to independence with ADLs PT Goal Formulation: With patient Time For Goal Achievement: 02/07/22 Potential to Achieve  Goals: Good    Frequency Min 2X/week     Co-evaluation               AM-PAC PT "6 Clicks" Mobility  Outcome Measure Help needed turning from your back to your side while in a flat bed without using bedrails?: A Little Help needed moving from lying on your back to sitting on the side of a flat bed without using bedrails?: A Little Help needed moving to and from a bed to a chair (including a wheelchair)?: A Little Help needed standing up from a chair using your arms (e.g., wheelchair or bedside chair)?: A Little Help needed to walk in hospital room?: A Little Help needed climbing 3-5 steps with a railing? : A Little 6 Click Score: 18    End of Session Equipment Utilized During Treatment: Gait belt Activity Tolerance: Patient tolerated treatment well;No increased pain Patient left: in bed;with call bell/phone within reach;with bed alarm set Nurse Communication: Mobility status PT Visit Diagnosis: Other abnormalities of gait and mobility (R26.89);Muscle weakness (generalized) (M62.81);History of falling (Z91.81)    Time: 4034-7425 PT Time Calculation (min) (ACUTE ONLY): 35 min   Charges:             Turner Daniels, SPT 01/24/22 16:24

## 2022-01-24 NOTE — Assessment & Plan Note (Signed)
-  fall precaution -PT/OT 

## 2022-01-24 NOTE — ED Provider Notes (Signed)
[facility] Provider Note    Event Date/Time   First MD Initiated Contact with Patient 01/24/22 0404     (approximate)   History   Fall   HPI  Vincent Hines is a 81 y.o. male with a history of CLL in remission, asthma, diabetes, bronchiectasis who presents from home for syncope.  Patient reports being in his usual state of health when he went to bed last night.  Woke up this morning to go to the bathroom.  He felt dizzy and syncopized.  Patient reports falling backwards with positive LOC which was witnessed by family member for about 2 minutes.  He is complaining of right-sided neck pain and a headache.  He is not on any blood thinners.  Patient reports that he was recently seen 20 days ago and diagnosed with pneumonia, has finished a course of antibiotics.  He denies chest pain or shortness of breath, abdominal pain, nausea or vomiting, fever or chills, diarrhea, dysuria or hematuria.  Patient denies low back pain or extremity pain.   Past Medical History:  Diagnosis Date   Asthma    Cancer ([place])    CLL    History reviewed. No pertinent surgical history.   Physical Exam   Triage Vital Signs: ED Triage Vitals  Enc Vitals Group     BP 01/24/22 0407 110/66     Pulse Rate 01/24/22 0407 77     Resp 01/24/22 0407 20     Temp 01/24/22 0407 97.7 F (36.5 C)     Temp Source 01/24/22 0407 Oral     SpO2 01/24/22 0407 96 %     Weight 01/24/22 0403 187 lb (84.8 kg)     Height 01/24/22 0403 '5\' 11"'$  (1.803 m)     Head Circumference --      Peak Flow --      Pain Score 01/24/22 0402 9     Pain Loc --      Pain Edu? --      Excl. in [place]? --     Most recent vital signs: Vitals:   01/24/22 0407 01/24/22 0550  BP: 110/66 140/76  Pulse: 77 78  Resp: 20 17  Temp: 97.7 F (36.5 C)   SpO2: 96% 100%    Full spinal precautions maintained throughout the trauma exam. Constitutional: Alert and oriented. No acute distress. Does not appear  intoxicated. HEENT Head: Normocephalic and atraumatic. Face: No facial bony tenderness. Stable midface Ears: No hemotympanum bilaterally. No Battle sign Eyes: No eye injury. PERRL. No raccoon eyes Nose: Nontender. No epistaxis. No rhinorrhea Mouth/Throat: Mucous membranes are moist. No oropharyngeal blood. No dental injury. Airway patent without stridor. Normal voice. Neck: no C-collar. No midline c-spine tenderness. R paraspinal tenderness Cardiovascular: Normal rate, regular rhythm. Normal and symmetric distal pulses are present in all extremities. Pulmonary/Chest: Chest wall is stable and nontender to palpation/compression. Normal respiratory effort. Breath sounds are normal. No crepitus.  Patient is hypoxic on arrival to the ED with sats of 88%.  Placed on 2 L nasal cannula Abdominal: Soft, nontender, non distended. Musculoskeletal: Nontender with normal full range of motion in all extremities. No deformities. No thoracic or lumbar midline spinal tenderness. Pelvis is stable. Skin: Skin is warm, dry and intact. No abrasions or contutions. Psychiatric: Speech and behavior are appropriate. Neurological: Normal speech and language. Moves all extremities to command. No gross focal neurologic deficits are appreciated.  Glascow Coma Score: 4 - Opens eyes on own 6 - Follows  simple motor commands 5 - Alert and oriented GCS: 15   ED Results / Procedures / Treatments   Labs (all labs ordered are listed, but only abnormal results are displayed) Labs Reviewed  CBC WITH DIFFERENTIAL/PLATELET - Abnormal; Notable for the following components:      Result Value   Platelets 133 (*)    All other components within normal limits  BASIC METABOLIC PANEL - Abnormal; Notable for the following components:   Glucose, Bld 161 (*)    Creatinine, Ser 1.50 (*)    GFR, Estimated 46 (*)    All other components within normal limits  URINALYSIS, COMPLETE (UACMP) WITH MICROSCOPIC  BRAIN NATRIURETIC PEPTIDE   TROPONIN I (HIGH SENSITIVITY)  TROPONIN I (HIGH SENSITIVITY)     EKG  ED ECG REPORT I, Rudene Re, the attending physician, personally viewed and interpreted this ECG.  Sinus rhythm with occasional PVCs, right bundle branch block, rate of 76, no ST elevations or depressions.  RADIOLOGY I, Rudene Re, attending MD, have personally viewed and interpreted the images obtained during this visit as below:  CT head and cervical spine with no acute traumatic injury  Chest x-ray with no signs of pneumonia or pneumothorax   ___________________________________________________ Interpretation by Radiologist:  DG Chest 2 View  Result Date: 01/24/2022 CLINICAL DATA:  81 year old male status post fall backwards at home. Pain. EXAM: CHEST - 2 VIEW COMPARISON:  Chest CT 05/11/2020. FINDINGS: Seated AP and lateral views of the chest at 0442 hours. Lower lung volumes. Stable cardiac size and mediastinal contours. No definite cardiomegaly. Visualized tracheal air column is within normal limits. Azygous fissure as seen by CT. No pneumothorax, pulmonary edema, pleural effusion or consolidation. Patchy bibasilar atelectasis. Osteopenia. No acute osseous abnormality identified. Paucity of bowel gas in the upper abdomen. IMPRESSION: Low lung volumes with bibasilar atelectasis. Electronically Signed   By: Genevie Ann M.D.   On: 01/24/2022 04:58   DG Thoracic Spine 2 View  Result Date: 01/24/2022 CLINICAL DATA:  81 year old male status post fall backwards at home. Pain. EXAM: THORACIC SPINE 2 VIEWS COMPARISON:  Chest radiographs today. Cervical spine CT today. Chest CT 05/11/2020. FINDINGS: Normal thoracic segmentation on the comparison CT. Cervicothoracic junction alignment better demonstrated on the cervical spine CT today. Mild chronic levoconvex lower thoracic scoliosis is stable. Chronic interbody ankylosis T9 through T11 or T12 from flowing endplate osteophytes. Stable thoracic vertebral height and  alignment since 2021. Relatively preserved disc spaces. Negative visible chest and abdominal visceral contours. IMPRESSION: No acute osseous abnormality identified in the thoracic spine. Electronically Signed   By: Genevie Ann M.D.   On: 01/24/2022 05:01   CT Head Wo Contrast  Result Date: 01/24/2022 CLINICAL DATA:  81 year old male status post fall backwards, striking head and neck on wall with loss of consciousness. Neck pain. EXAM: CT HEAD WITHOUT CONTRAST TECHNIQUE: Contiguous axial images were obtained from the base of the skull through the vertex without intravenous contrast. RADIATION DOSE REDUCTION: This exam was performed according to the departmental dose-optimization program which includes automated exposure control, adjustment of the mA and/or kV according to patient size and/or use of iterative reconstruction technique. COMPARISON:  Brain MRI 11/30/2020. FINDINGS: Brain: Cerebral volume is within normal limits for age. No midline shift, ventriculomegaly, mass effect, evidence of mass lesion, intracranial hemorrhage or evidence of cortically based acute infarction. Gray-white matter differentiation within normal limits for age throughout the brain, stable to the MRI last month. Vascular: Mild Calcified atherosclerosis at the skull base. No suspicious  intracranial vascular hyperdensity. Skull: No skull fracture identified. Sinuses/Orbits: Previous paranasal sinus surgery. Mild to moderate maxillary sinus mucosal thickening has not significantly changed. No acute sinus fluid level. Tympanic cavities and mastoids are well aerated. Other: No discrete scalp hematoma. Postoperative changes to both globes, otherwise negative orbits soft tissues. IMPRESSION: No acute traumatic injury to the head. Normal for age non contrast CT appearance of the brain. Electronically Signed   By: Genevie Ann M.D.   On: 01/24/2022 04:46   CT Angio Chest PE W and/or Wo Contrast  Result Date: 01/24/2022 CLINICAL DATA:  81 year old  male with history of fall with loss of consciousness for approximately 2 minutes. Evaluate for pulmonary embolism. EXAM: CT ANGIOGRAPHY CHEST WITH CONTRAST TECHNIQUE: Multidetector CT imaging of the chest was performed using the standard protocol during bolus administration of intravenous contrast. Multiplanar CT image reconstructions and MIPs were obtained to evaluate the vascular anatomy. RADIATION DOSE REDUCTION: This exam was performed according to the departmental dose-optimization program which includes automated exposure control, adjustment of the mA and/or kV according to patient size and/or use of iterative reconstruction technique. CONTRAST:  74m OMNIPAQUE IOHEXOL 350 MG/ML SOLN COMPARISON:  Chest CT 05/11/2020. FINDINGS: Cardiovascular: There are no filling defects within the pulmonary arterial tree to suggest pulmonary embolism. Heart size is normal. There is no significant pericardial fluid, thickening or pericardial calcification. There is aortic atherosclerosis, as well as atherosclerosis of the great vessels of the mediastinum and the coronary arteries, including calcified atherosclerotic plaque in the left anterior descending and right coronary arteries. Mediastinum/Nodes: No pathologically enlarged mediastinal or hilar lymph nodes. Small to moderate hiatal hernia. No axillary lymphadenopathy. Lungs/Pleura: Patchy areas of ground-glass attenuation, thickening of the peribronchovascular interstitium and peribronchovascular airspace consolidation noted in the lung bases bilaterally, most severe in the left lower lobe where there is extensive opacification of bronchi, and some cylindrical bronchiectasis. No pleural effusions. Azygous lobe (normal anatomical variant) incidentally noted. Upper Abdomen: Unremarkable. Musculoskeletal: There are no aggressive appearing lytic or blastic lesions noted in the visualized portions of the skeleton. Review of the MIP images confirms the above findings.  IMPRESSION: 1. No evidence of pulmonary embolism. 2. Multilobar pneumonia, as above. Given the basilar and dependent distribution, along with the opacification of multiple bronchi and areas of bronchiectasis, clinical correlation for signs and symptoms of recurrent aspiration is recommended. Speech therapy swallow study should be considered. 3. Aortic atherosclerosis, in addition to two-vessel coronary artery disease. 4. Small to moderate-sized hiatal hernia. Aortic Atherosclerosis (ICD10-I70.0). Electronically Signed   By: DVinnie LangtonM.D.   On: 01/24/2022 06:24   CT Cervical Spine Wo Contrast  Result Date: 01/24/2022 CLINICAL DATA:  81year old male status post fall backwards, striking head and neck on wall with loss of consciousness. Neck pain. EXAM: CT CERVICAL SPINE WITHOUT CONTRAST TECHNIQUE: Multidetector CT imaging of the cervical spine was performed without intravenous contrast. Multiplanar CT image reconstructions were also generated. RADIATION DOSE REDUCTION: This exam was performed according to the departmental dose-optimization program which includes automated exposure control, adjustment of the mA and/or kV according to patient size and/or use of iterative reconstruction technique. COMPARISON:  Head CT today reported separately. Brain MRI 11/30/2020. FINDINGS: Alignment: Straightening of cervical lordosis. Mild anterolisthesis of C7 on T1 appears to be degenerative in nature. Subtle similar anterolisthesis of C3 on C4. Skull base and vertebrae: Visualized skull base is intact. No atlanto-occipital dissociation. C1 and C2 appear intact and aligned, with advanced degeneration described below. Small ossific fragments lateral to the  right C1-C2 articulation (coronal image 24) do not appear acute. No acute osseous abnormality identified. Soft tissues and spinal canal: No prevertebral fluid or swelling. No visible canal hematoma. Negative visible noncontrast neck soft tissues. Disc levels: Advanced  anterior C1-C2 joint space loss with bulky osteophytosis and probable developing ankylosis. Widespread bilateral cervical facet hypertrophy. Advanced disc and endplate degeneration G2-E3 through C6-C7. Subsequent mild degenerative spinal stenosis suspected at C4-C5. Upper chest: Mild degenerative appearing anterolisthesis of T1 on T2. Grossly intact visible upper thoracic vertebrae. Lung apices are negative; right side azygous fissure (normal variant). Negative noncontrast thoracic inlet soft tissues. IMPRESSION: 1. No acute traumatic injury identified in the cervical spine. 2. 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 Electronically Signed   By: Genevie Ann M.D.   On: 01/24/2022 04:51       PROCEDURES:  Critical Care performed: Yes, see critical care procedure note(s)  .Critical Care Performed by: Rudene Re, MD Authorized by: Rudene Re, MD   Critical care provider statement:    Critical care time (minutes):  30   Critical care time was exclusive of:  Separately billable procedures and treating other patients   Critical care was necessary to treat or prevent imminent or life-threatening deterioration of the following conditions:  Respiratory failure, circulatory failure, cardiac failure, CNS failure or compromise and shock   Critical care was time spent personally by me on the following activities:  Development of treatment plan with patient or surrogate, discussions with consultants, evaluation of patient's response to treatment, examination of patient, ordering and review of laboratory studies, ordering and review of radiographic studies, ordering and performing treatments and interventions, pulse oximetry, re-evaluation of patient's condition and review of old charts   I assumed direction of critical care for this patient from another provider in my specialty: no     Care discussed with:  admitting provider      IMPRESSION / MDM / [place] / ED COURSE  I reviewed the triage vital signs and the nursing notes.  81 y.o. male with a history of CLL, asthma, diabetes, bronchiectasis who presents from home for syncope with collapse and head trauma.  Patient found to be hypoxic on arrival to the ED.  He was recently treated by pneumonia.  He denies any shortness of breath or chest pain.  Has finished a course of antibiotics.  He is afebrile.  No signs of traumatic injury on examination.  Ddx: Cardiac dysrhythmia versus orthostasis versus hypoxia leading to syncope versus anemia versus dehydration versus AKI versus vasovagal episode   Plan: We will monitor on telemetry for any signs of dysrhythmias.  EKG and 2 high-sensitivity troponins for any signs of demand ischemia.  Labs including CBC and metabolic panel.  We will get CT head and C-spine, chest x-ray, and thoracic spine x-ray.  Will give Tylenol for pain. If all negative, will pursue CTA to rule out PE as cause of syncope especially since patient is hypoxic.   MEDICATIONS GIVEN IN ED: Medications  Ampicillin-Sulbactam (UNASYN) 3 g in sodium chloride 0.9 % 100 mL IVPB (has no administration in time range)  acetaminophen (TYLENOL) tablet 1,000 mg (1,000 mg Oral Given 01/24/22 0455)  ipratropium-albuterol (DUONEB) 0.5-2.5 (3) MG/3ML nebulizer solution 3 mL (3 mLs Nebulization Given 01/24/22 0535)  ipratropium-albuterol (DUONEB) 0.5-2.5 (3) MG/3ML nebulizer solution 3 mL (3 mLs Nebulization Given 01/24/22 0534)  lactated ringers bolus 1,000 mL (1,000 mLs Intravenous New Bag/Given  01/24/22 0535)  iohexol (OMNIPAQUE) 350 MG/ML injection 75 mL (75 mLs Intravenous Contrast Given 01/24/22 0607)     ED COURSE: CT of the chest concerning for pneumonia possibly aspiration.  Patient started on Unasyn.  No signs of sepsis.  Remains with new oxygen requirement of 2 L.  EKG and troponin without any evidence of ischemia.  Hospitalist  service was consulted and after discussion have accepted patient to their service   Consults: Hospitalist   EMR reviewed including records from patient's last visit with his primary care doctor from yesterday and his pulmonologist from 2 weeks ago    FINAL CLINICAL IMPRESSION(S) / ED DIAGNOSES   Final diagnoses:  Syncope, unspecified syncope type  Acute respiratory failure with hypoxia ([place])  Aspiration pneumonia, unspecified aspiration pneumonia type, unspecified laterality, unspecified part of lung ([place])     Rx / DC Orders   ED Discharge Orders     None        Note:  This document was prepared using Dragon voice recognition software and may include unintentional dictation errors.   Please note:  Patient was evaluated in Emergency Department today for the symptoms described in the history of present illness. Patient was evaluated in the context of the global COVID-19 pandemic, which necessitated consideration that the patient might be at risk for infection with the SARS-CoV-2 virus that causes COVID-19. Institutional protocols and algorithms that pertain to the evaluation of patients at risk for COVID-19 are in a state of rapid change based on information released by regulatory bodies including the CDC and federal and state organizations. These policies and algorithms were followed during the patient's care in the ED.  Some ED evaluations and interventions may be delayed as a result of limited staffing during the pandemic.       Alfred Levins, [place], MD 01/24/22 [phone]

## 2022-01-24 NOTE — Assessment & Plan Note (Addendum)
Possible aspiration pneumonia.  Patient has oxygen desaturation to 88%, has 2 L new oxygen requirement. -The patient has had 3 days of IV Unasyn -Converted to Augmentin for discharge -Bronchodilators -SLP - No risk of aspiration. Regular diet and general aspiration precautions recommended Including slowing down when eating and drinking as well as general GERD precautions.

## 2022-01-24 NOTE — Assessment & Plan Note (Signed)
-   Bronchodilators 

## 2022-01-25 DIAGNOSIS — Z794 Long term (current) use of insulin: Secondary | ICD-10-CM | POA: Diagnosis not present

## 2022-01-25 DIAGNOSIS — Z7951 Long term (current) use of inhaled steroids: Secondary | ICD-10-CM | POA: Diagnosis not present

## 2022-01-25 DIAGNOSIS — T446X5A Adverse effect of alpha-adrenoreceptor antagonists, initial encounter: Secondary | ICD-10-CM | POA: Diagnosis present

## 2022-01-25 DIAGNOSIS — C9111 Chronic lymphocytic leukemia of B-cell type in remission: Secondary | ICD-10-CM | POA: Diagnosis present

## 2022-01-25 DIAGNOSIS — J690 Pneumonitis due to inhalation of food and vomit: Secondary | ICD-10-CM | POA: Diagnosis present

## 2022-01-25 DIAGNOSIS — N401 Enlarged prostate with lower urinary tract symptoms: Secondary | ICD-10-CM | POA: Diagnosis present

## 2022-01-25 DIAGNOSIS — E1142 Type 2 diabetes mellitus with diabetic polyneuropathy: Secondary | ICD-10-CM | POA: Diagnosis present

## 2022-01-25 DIAGNOSIS — E860 Dehydration: Secondary | ICD-10-CM | POA: Diagnosis present

## 2022-01-25 DIAGNOSIS — R55 Syncope and collapse: Secondary | ICD-10-CM | POA: Diagnosis present

## 2022-01-25 DIAGNOSIS — N138 Other obstructive and reflux uropathy: Secondary | ICD-10-CM | POA: Diagnosis present

## 2022-01-25 DIAGNOSIS — Z20822 Contact with and (suspected) exposure to covid-19: Secondary | ICD-10-CM | POA: Diagnosis present

## 2022-01-25 DIAGNOSIS — Z91013 Allergy to seafood: Secondary | ICD-10-CM | POA: Diagnosis not present

## 2022-01-25 DIAGNOSIS — J4520 Mild intermittent asthma, uncomplicated: Secondary | ICD-10-CM | POA: Diagnosis not present

## 2022-01-25 DIAGNOSIS — N179 Acute kidney failure, unspecified: Secondary | ICD-10-CM | POA: Diagnosis present

## 2022-01-25 DIAGNOSIS — J45909 Unspecified asthma, uncomplicated: Secondary | ICD-10-CM | POA: Diagnosis present

## 2022-01-25 DIAGNOSIS — I5042 Chronic combined systolic (congestive) and diastolic (congestive) heart failure: Secondary | ICD-10-CM | POA: Diagnosis present

## 2022-01-25 DIAGNOSIS — J470 Bronchiectasis with acute lower respiratory infection: Secondary | ICD-10-CM | POA: Diagnosis present

## 2022-01-25 DIAGNOSIS — N1831 Chronic kidney disease, stage 3a: Secondary | ICD-10-CM | POA: Diagnosis present

## 2022-01-25 DIAGNOSIS — B009 Herpesviral infection, unspecified: Secondary | ICD-10-CM | POA: Diagnosis present

## 2022-01-25 DIAGNOSIS — D469 Myelodysplastic syndrome, unspecified: Secondary | ICD-10-CM | POA: Diagnosis present

## 2022-01-25 DIAGNOSIS — Z79899 Other long term (current) drug therapy: Secondary | ICD-10-CM | POA: Diagnosis not present

## 2022-01-25 DIAGNOSIS — D6959 Other secondary thrombocytopenia: Secondary | ICD-10-CM | POA: Diagnosis present

## 2022-01-25 LAB — BASIC METABOLIC PANEL
Anion gap: 7 (ref 5–15)
BUN: 22 mg/dL (ref 8–23)
CO2: 24 mmol/L (ref 22–32)
Calcium: 8.2 mg/dL — ABNORMAL LOW (ref 8.9–10.3)
Chloride: 108 mmol/L (ref 98–111)
Creatinine, Ser: 1.12 mg/dL (ref 0.61–1.24)
GFR, Estimated: >60 mL/min (ref >60)
Glucose, Bld: 179 mg/dL — ABNORMAL HIGH (ref 70–99)
Potassium: 3.6 mmol/L (ref 3.5–5.1)
Sodium: 139 mmol/L (ref 135–145)

## 2022-01-25 LAB — CBC
HCT: 35.1 % — ABNORMAL LOW (ref 39.0–52.0)
Hemoglobin: 12.1 g/dL — ABNORMAL LOW (ref 13.0–17.0)
MCH: 33.1 pg (ref 26.0–34.0)
MCHC: 34.5 g/dL (ref 30.0–36.0)
MCV: 95.9 fL (ref 80.0–100.0)
Platelets: 108 10*3/uL — ABNORMAL LOW (ref 150–400)
RBC: 3.66 MIL/uL — ABNORMAL LOW (ref 4.22–5.81)
RDW: 13.2 % (ref 11.5–15.5)
WBC: 4.3 10*3/uL (ref 4.0–10.5)
nRBC: 0 % (ref 0.0–0.2)

## 2022-01-25 NOTE — Progress Notes (Signed)
PROGRESS NOTE  Vincent Hines KYH:[id number] DOB: [birth year] DOA: 01/24/2022 PCP: Dion Body, MD  Brief History   Vincent Hines is a 81 y.o. male with medical history significant of CLL in remission, monthly IVIG infusion in [facility], MDS, asthma, bronchiectasis, CHF with EF of 45-50%, CKD stage IIIa, intentional tremor, remote seizure, BPH, HSV-2 infection, who presents with cough and syncope.   Pt states that pt was in his usual state of health when he went to bed last night. He woke up this morning to go to the bathroom.  He felt dizzy and passed out for about 2 minutes.  Vincent Hines states that he took CBD gum yesterday evening.  He believes that CBD pill caused his syncope. Per his daughter, pt did not use his cane. He has numbness in both feet and toes. Vincent Hines reports falling backwards.  He injured his right side of neck and head.  He has right-sided neck pain.  Vincent Hines does not have unilateral numbness or tinglings in extremities.  No facial droop or slurred speech. He reports cough with yellow-colored sputum production, mild shortness breath.  Denies chest pain, fever or chills.  Vincent Hines has nausea, no vomiting, diarrhea or abdominal pain.  No symptoms of UTI.  Vincent Hines was found to have oxygen desaturation to 88% on room air, which improved to 96-100% on 2 L oxygen.  Vincent Hines is normally not using oxygen.  Vincent Hines states that she recently completed a course of Augmentin for pneumonia approximately 20 days ago.   Data Reviewed and ED Course: pt was found to have WBC 8.3, troponin level 9, BNP 21, worsening renal function with creatinine 1.50, BUN 21 (baseline creatinine 1.13 on 01/02/2022), temperature normal, blood pressure 140/76, heart rate 78, RR 20, chest x-ray showed low volume and bilateral basilar atelectasis.  X-ray of the T-spine negative.  CT head negative.  CT of the C-spine is negative for acute injury, but showed degenerative disc disease.  CT angiogram is negative for PE, showed multifocal  infiltration for possible aspiration pneumonia.  Vincent Hines is placed on telemetry bed for observation.  Echocardiogram has been ordered to rule out cardiac cause of syncope. It is pending.  Consultants  None  Procedures  None  Antibiotics   Anti-infectives (From admission, onward)    Start     Dose/Rate Route Frequency Ordered Stop   01/24/22 1400  Ampicillin-Sulbactam (UNASYN) 3 g in sodium chloride 0.9 % 100 mL IVPB        3 g 200 mL/hr over 30 Minutes Intravenous Every 6 hours 01/24/22 0816     01/24/22 1330  levofloxacin (LEVAQUIN) tablet 250 mg  Status:  Discontinued        250 mg Oral Every M-W-F 01/24/22 1242 01/24/22 1902   01/24/22 1330  tobramycin (PF) (TOBI) nebulizer solution 300 mg        300 mg Nebulization 2 times daily 01/24/22 1242     01/24/22 1330  valACYclovir (VALTREX) tablet 1,000 mg        1,000 mg Oral 2 times daily 01/24/22 1242     01/24/22 0645  Ampicillin-Sulbactam (UNASYN) 3 g in sodium chloride 0.9 % 100 mL IVPB        3 g 200 mL/hr over 30 Minutes Intravenous  Once 01/24/22 1517 01/24/22 1035      Subjective  [name] is resting comfortably. No new complaints.   Objective   Vitals:  Vitals:   01/25/22 1024 01/25/22 1151  BP: 126/63 (!) 142/65  Pulse:  72  Resp:  18  Temp:  (!) 97.4 F (36.3 C)  SpO2:  100%    Exam:  Constitutional:  Appears calm and comfortable Respiratory:  CTA bilaterally, no w/r/r.  Respiratory effort normal. No retractions or accessory muscle use Cardiovascular:  RRR, no m/r/g No LE extremity edema   Normal pedal pulses Abdomen:  Abdomen appears normal; no tenderness or masses No hernias No HSM Musculoskeletal:  Digits/nails BUE: no clubbing, cyanosis, petechiae, infection exam of joints, bones, muscles of at least one of following: head/neck, RUE, LUE, RLE, LLE   strength and tone normal, no atrophy, no abnormal movements No tenderness, masses Normal ROM, no contractures  gait and station Skin:   No rashes, lesions, ulcers palpation of skin: no induration or nodules Neurologic:  CN 2-12 intact Sensation all 4 extremities intact Psychiatric:  Mental status Mood, affect appropriate Orientation to person, place, time  judgment and insight appear intact     I have personally reviewed the following:   Today's Data  Vitals  Lab Data  CBC, BMP  Micro Data    Imaging  CTA chest CT C-spine CT head  Cardiology Data  EKG  Other Data    Scheduled Meds:  baclofen  20 mg Oral QHS   enoxaparin (LOVENOX) injection  40 mg Subcutaneous Q24H   gabapentin  600 mg Oral QHS   ipratropium-albuterol  3 mL Nebulization TID   lidocaine  1 patch Transdermal Q24H   montelukast  10 mg Oral QHS   pantoprazole  40 mg Oral Daily   pilocarpine  5 mg Oral TID   tamsulosin  0.4 mg Oral Daily   tobramycin (PF)  300 mg Nebulization BID   valACYclovir  1,000 mg Oral BID   Continuous Infusions:  sodium chloride 75 mL/hr at 01/25/22 0313   ampicillin-sulbactam (UNASYN) IV 3 g (01/25/22 1550)    Principal Problem:   Syncope Active Problems:   Fall at home, initial encounter   Aspiration pneumonia ([place])   Asthma   Bronchiectasis ([place])   Chronic lymphoid leukemia in remission ([place])   Acute renal failure superimposed on stage 3a chronic kidney disease (HCC)   Chronic combined systolic and diastolic CHF (congestive heart failure) (HCC)   HSV-2 infection   Thrombocytopenia (HCC)   LOS: 0 days   A & P  Syncope Etiology is not clear.  Likely multifactorial etiology.  Vincent Hines states that he took CBD gummy.  Per his daughter, Vincent Hines is not using his cane.  Vincent Hines has possible peripheral neuropathy and numbness in both feet and toes.  No unilateral neurodeficit on physical examination.  Low suspicions of stroke.  Vincent Hines denies any chest pain.  CT angiogram negative for PE.  Recent vitamin B12 level was above normal.    -Placed on telemetry bed for position -Frequent  neurochecks -PT/OT -Check orthostatic vital sign -IV fluid: 1 L LR, then 75 cc/h of normal saline -Echocardiogram is pending to rule out potential cardiac cause for syncope.   Fall at home, initial encounter -fall precaution -PT/OT   Aspiration pneumonia ([place]) Possible aspiration pneumonia.  Vincent Hines has oxygen desaturation to 88%, has 2 L new oxygen requirement. -Start Unasyn -Bronchodilators -Follow-up sputum culture -SLP   Asthma - Bronchodilators   Bronchiectasis (HCC) - Bronchodilators -Vincent Hines is tobramycin nebulizer   Chronic lymphoid leukemia in remission ([place]) - Stable.  WBC 8.3.  Vincent Hines is receiving monthly IVIG in [facility].  Next dose will be on 01/30/2022.   Acute renal failure superimposed on stage 3a  chronic kidney disease ([place]) Recent baseline creatinine 1.13 on 01/02/2022.  His creatinine is 1.50, BUN 21. -Hold naproxen -IV fluid as above -Avoid renal toxic medications   Chronic combined systolic and diastolic CHF (congestive heart failure) (HCC) 2D echo on 10/29/2013 showed EF of 45-50% with grade 1 diastolic dysfunction.  Vincent Hines does not have leg edema or JVD.  BNP 21.  CHF seem to be compensated. -Watch volume status closely   HSV-2 infection - Continue Valtrex   Thrombocytopenia (HCC) Platelet of 133.  Mental status normal. -Check LDH --> 126 normal -Check peripheral smear    I have seen and examined this Vincent Hines myself. I have spent 34 minutes in his evaluation and care.  DVT prophylaxis: Lovenox Code Status: Full Code Family Communication: None available Disposition Plan: Home    Skyeler Scalese, DO [facility] Direct contact: see [email or web address]  7PM-7AM contact night coverage as above 01/25/2022, 4:26 PM  LOS: 0 days

## 2022-01-25 NOTE — Evaluation (Signed)
Occupational Therapy Evaluation Patient Details Name: Vincent Hines MRN: [id number] DOB: 18-Mar-1941 Today's Date: 01/25/2022   History of Present Illness Vincent Hines is a 81 y.o. male with a history of CLL in remission, asthma, diabetes, bronchiectasis who presents from home for syncope.  Patient reports being in his usual state of health when he went to bed last night.  Woke up this morning to go to the bathroom.  He felt dizzy and syncopized.  Patient reports falling backwards with positive LOC which was witnessed by family member for about 2 minutes.   Clinical Impression   Chart reviewed, pt greeted in bed agreeable to OT evaluation. Pt is alert and oriented x4, able to follow multi step directions. PTA pt reports he amb with a SPC PRN however typically does not utilize it, he is MOD I-I in all ADL/IADL including cooking,cleaning, driving. Pt presents with deficits in activity tolerance and knowledge/use of DME as he is amb with RW at this time with intermittent vcs required for safe RW use, pacing. Attempted functional household distances ADL amb without AD with CGA. OT will follow acutely to address deficits facilitate safe discharge home, no OT recommended following discharge.      Recommendations for follow up therapy are one component of a multi-disciplinary discharge planning process, led by the attending physician.  Recommendations may be updated based on patient status, additional functional criteria and insurance authorization.   Follow Up Recommendations  No OT follow up    Assistance Recommended at Discharge Intermittent Supervision/Assistance  Patient can return home with the following Assistance with cooking/housework    Functional Status Assessment  Patient has had a recent decline in their functional status and demonstrates the ability to make significant improvements in function in a reasonable and predictable amount of time.  Equipment Recommendations  None recommended by OT     Recommendations for Other Services       Precautions / Restrictions Precautions Precautions: Fall Restrictions Weight Bearing Restrictions: No      Mobility Bed Mobility Overal bed mobility: Needs Assistance Bed Mobility: Supine to Sit, Sit to Supine     Supine to sit: Min assist, HOB elevated Sit to supine: Supervision, HOB elevated        Transfers Overall transfer level: Needs assistance Equipment used: Rolling walker (2 wheels) Transfers: Sit to/from Stand Sit to Stand: Modified independent (Device/Increase time) (with RW)                  Balance Overall balance assessment: Modified Independent, History of Falls   Sitting balance-Leahy Scale: Good     Standing balance support: Bilateral upper extremity supported, During functional activity Standing balance-Leahy Scale: Good                             ADL either performed or assessed with clinical judgement   ADL Overall ADL's : Needs assistance/impaired     Grooming: Wash/dry hands;Modified independent;Standing Grooming Details (indicate cue type and reason): with RW at sink level             Lower Body Dressing: Modified independent Lower Body Dressing Details (indicate cue type and reason): socks in seated Toilet Transfer: Modified Independent;Ambulation;Rolling walker (2 wheels)   Toileting- Clothing Manipulation and Hygiene: Sit to/from stand;Set up [place] Manipulation Details (indicate cue type and reason): standing with RW to use urinal     Functional mobility during ADLs: Supervision/safety;Modified independent;Min guard (CGA for  Amb with no AD approx 150', supervision-MOD I with RW, intermittent vcs for technique)       Vision Patient Visual Report: No change from baseline       Perception     Praxis      Pertinent Vitals/Pain Pain Assessment Pain Assessment: Faces Faces Pain Scale: Hurts a little bit Pain Location: neck Pain Descriptors /  Indicators: Aching Pain Intervention(s): Limited activity within patient's tolerance, Monitored during session, Repositioned     Hand Dominance     Extremity/Trunk Assessment Upper Extremity Assessment Upper Extremity Assessment: Overall WFL for tasks assessed   Lower Extremity Assessment Lower Extremity Assessment: Defer to PT evaluation;Overall WFL for tasks assessed       Communication Communication Communication: No difficulties   Cognition Arousal/Alertness: Awake/alert Behavior During Therapy: WFL for tasks assessed/performed Overall Cognitive Status: Within Functional Limits for tasks assessed                                 General Comments: good safety awareness, able to follow 4 step multi step direction     General Comments  BP 126/63 seated at edge of bed HR 68, BP 137/66 HR 60 after mobility seated in chair    Exercises Other Exercises Other Exercises: edu re: role of OT, role of rehab, discharge recommendations, safe ADL completion, falls prevention   Shoulder Instructions      Home Living Family/patient expects to be discharged to:: Private residence Living Arrangements: Children (daugther) Available Help at Discharge: Family;Available 24 hours/day Type of Home: House Home Access: Stairs to enter [employer] of Steps: 2 steps to enter Entrance Stairs-Rails: Left Home Layout: One level     Bathroom Shower/Tub: Occupational psychologist: Standard Bathroom Accessibility: Yes   Home Equipment: Cane - single point          Prior Functioning/Environment Prior Level of Function : Independent/Modified Independent             Mobility Comments: using SPC as needed ADLs Comments: MOD I-I in ADL; retired, drives, cooks/cleans        OT Problem List: Decreased activity tolerance;Decreased knowledge of use of DME or AE      OT Treatment/Interventions: Self-care/ADL training;Patient/family education;DME and/or AE  instruction;Therapeutic activities;Therapeutic exercise    OT Goals(Current goals can be found in the care plan section) Acute Rehab OT Goals Patient Stated Goal: go home OT Goal Formulation: With patient Time For Goal Achievement: 02/08/22 Potential to Achieve Goals: Good ADL Goals Pt Will Perform Lower Body Dressing: Independently Pt Will Transfer to Toilet: with modified independence Pt Will Perform Toileting - Clothing Manipulation and hygiene: with modified independence  OT Frequency: Min 2X/week    Co-evaluation              AM-PAC OT "6 Clicks" Daily Activity     Outcome Measure Help from another person eating meals?: None Help from another person taking care of personal grooming?: None Help from another person toileting, which includes using toliet, bedpan, or urinal?: None Help from another person bathing (including washing, rinsing, drying)?: None Help from another person to put on and taking off regular upper body clothing?: None Help from another person to put on and taking off regular lower body clothing?: None 6 Click Score: 24   End of Session Equipment Utilized During Treatment: Gait belt;Rolling walker (2 wheels) Nurse Communication: Mobility status  Activity Tolerance: Patient tolerated  treatment well Patient left: in bed;with call bell/phone within reach;with bed alarm set  OT Visit Diagnosis: History of falling (Z91.81);Unsteadiness on feet (R26.81)                Time: 6314-9702 OT Time Calculation (min): 31 min Charges:  OT General Charges $OT Visit: 1 Visit OT Evaluation $OT Eval Low Complexity: 1 Low  {Brownie Gockel, OTD OTR/L  01/25/22, 10:32 AM

## 2022-01-25 NOTE — Progress Notes (Signed)
Physical Therapy Treatment Patient Details Name: Vincent Hines MRN: [id number] DOB: 1941-07-08 Today's Date: 01/25/2022   History of Present Illness Vincent Hines is a 81 y.o. male with a history of CLL in remission, asthma, diabetes, bronchiectasis who presents from home for syncope.  Patient reports being in his usual state of health when he went to bed last night.  Woke up this morning to go to the bathroom.  He felt dizzy and syncopized.  Patient reports falling backwards with positive LOC which was witnessed by family member for about 2 minutes.    PT Comments    Pt seen for PT tx with pt agreeable. Pt reports he uses SPC PRN prior to admission, but otherwise ambulates without AD. Pt encouraged to attempt gait without AD on this date & is able to ambulate 1 lap around nurses station with CGA. Provided pt with [facility] & pt able to ambulate with supervision. Pt demonstrates slightly increased gait speed with shuffled gait & gait pattern as noted below. Will continue to follow pt acutely to address high level balance, gait & stairs with LRAD.    Recommendations for follow up therapy are one component of a multi-disciplinary discharge planning process, led by the attending physician.  Recommendations may be updated based on patient status, additional functional criteria and insurance authorization.  Follow Up Recommendations  No PT follow up     Assistance Recommended at Discharge Intermittent Supervision/Assistance  Patient can return home with the following A little help with walking and/or transfers;A little help with bathing/dressing/bathroom   Equipment Recommendations  Rolling walker (2 wheels)    Recommendations for Other Services       Precautions / Restrictions Precautions Precautions: Fall Restrictions Weight Bearing Restrictions: No     Mobility  Bed Mobility Overal bed mobility: Modified Independent Bed Mobility: Supine to Sit, Sit to Supine     Supine to sit: Modified  independent (Device/Increase time), HOB elevated Sit to supine: HOB elevated, Modified independent (Device/Increase time)        Transfers Overall transfer level: Needs assistance Equipment used: None Transfers: Sit to/from Stand Sit to Stand: Min guard                Ambulation/Gait Ambulation/Gait assistance: Supervision, Min guard Gait Distance (Feet): 425 Feet Assistive device: None, Straight cane Gait Pattern/deviations: Decreased stance time - left, Decreased weight shift to left, Decreased step length - right, Decreased step length - left, Decreased dorsiflexion - right, Decreased dorsiflexion - left, Shuffle       General Gait Details: Pt with rounded shoulders & downward gaze, reports difficulty uprighting neck/posture 2/2 old neck injury. Pt demonstrates shuffled gait & decreased weight shift to LLE during stance phase.   Stairs             Wheelchair Mobility    Modified Rankin (Stroke Patients Only)       Balance Overall balance assessment: Needs assistance   Sitting balance-Leahy Scale: Good     Standing balance support: No upper extremity supported, During functional activity Standing balance-Leahy Scale: Poor Standing balance comment: ambulates without AD with CGA                            Cognition Arousal/Alertness: Awake/alert Behavior During Therapy: WFL for tasks assessed/performed Overall Cognitive Status: Within Functional Limits for tasks assessed  Exercises      General Comments General comments (skin integrity, edema, etc.): Retrograde gait x 25 ft without AD with min assist with focus on high level balance training. At end of session HR 89 bpm, SpO2 98% on room air      Pertinent Vitals/Pain Pain Assessment Pain Assessment: No/denies pain    Home Living                          Prior Function            PT Goals (current goals can  now be found in the care plan section) Acute Rehab PT Goals Patient Stated Goal: Pt stated they would like increase overall strength and balance to return to independence with ADLs PT Goal Formulation: With patient Time For Goal Achievement: 02/07/22 Potential to Achieve Goals: Good Progress towards PT goals: Progressing toward goals    Frequency    Min 2X/week      PT Plan Current plan remains appropriate    Co-evaluation              AM-PAC PT "6 Clicks" Mobility   Outcome Measure  Help needed turning from your back to your side while in a flat bed without using bedrails?: None Help needed moving from lying on your back to sitting on the side of a flat bed without using bedrails?: None Help needed moving to and from a bed to a chair (including a wheelchair)?: A Little Help needed standing up from a chair using your arms (e.g., wheelchair or bedside chair)?: A Little Help needed to walk in hospital room?: A Little Help needed climbing 3-5 steps with a railing? : A Little 6 Click Score: 20    End of Session Equipment Utilized During Treatment: Gait belt Activity Tolerance: Patient tolerated treatment well Patient left: in bed;with call bell/phone within reach;with bed alarm set Nurse Communication: Mobility status PT Visit Diagnosis: Other abnormalities of gait and mobility (R26.89);Muscle weakness (generalized) (M62.81);Unsteadiness on feet (R26.81)     Time: 8676-1950 PT Time Calculation (min) (ACUTE ONLY): 10 min  Charges:  $Therapeutic Activity: 8-22 mins                     Lavone Nian, PT, DPT 01/25/22, 3:02 PM    Waunita Schooner 01/25/2022, 3:01 PM

## 2022-01-26 ENCOUNTER — Inpatient Hospital Stay
Admit: 2022-01-26 | Discharge: 2022-01-26 | Disposition: A | Discharge status: Home / Self Care | Payer: Medicare Other | Attending: Internal Medicine | Admitting: Internal Medicine

## 2022-01-26 DIAGNOSIS — D696 Thrombocytopenia, unspecified: Secondary | ICD-10-CM

## 2022-01-26 DIAGNOSIS — J690 Pneumonitis due to inhalation of food and vomit: Secondary | ICD-10-CM | POA: Diagnosis not present

## 2022-01-26 DIAGNOSIS — I5032 Chronic diastolic (congestive) heart failure: Secondary | ICD-10-CM | POA: Diagnosis present

## 2022-01-26 DIAGNOSIS — N179 Acute kidney failure, unspecified: Secondary | ICD-10-CM | POA: Diagnosis not present

## 2022-01-26 DIAGNOSIS — J4520 Mild intermittent asthma, uncomplicated: Secondary | ICD-10-CM | POA: Diagnosis not present

## 2022-01-26 DIAGNOSIS — R55 Syncope and collapse: Secondary | ICD-10-CM | POA: Diagnosis not present

## 2022-01-26 LAB — ECHOCARDIOGRAM COMPLETE
Area-P 1/2: 5.02 cm2
S' Lateral: 2.9 cm

## 2022-01-26 MED ORDER — PREDNISONE 20 MG PO TABS: 20.0000 mg | ORAL_TABLET | Freq: Three times a day (TID) | ORAL | Status: AC

## 2022-01-26 MED ORDER — AMOXICILLIN-POT CLAVULANATE 875-125 MG PO TABS: 1.0000 | ORAL_TABLET | Freq: Two times a day (BID) | ORAL | 0 refills | Status: AC

## 2022-01-26 MED ORDER — AMOXICILLIN-POT CLAVULANATE 875-125 MG PO TABS
1.0000 | ORAL_TABLET | Freq: Two times a day (BID) | ORAL | Status: DC
  Administered 2022-01-26: 1 via ORAL
  Filled 2022-01-26: qty 1

## 2022-01-26 MED ORDER — BACLOFEN 10 MG PO TABS: 10.0000 mg | ORAL_TABLET | Freq: Every day | ORAL | 0 refills | Status: AC

## 2022-01-26 MED ORDER — TRAZODONE HCL 50 MG PO TABS: 150.0000 mg | ORAL_TABLET | Freq: Every evening | ORAL | Status: DC | PRN

## 2022-01-26 MED ORDER — GABAPENTIN 300 MG PO CAPS: 300.0000 mg | ORAL_CAPSULE | Freq: Every day | ORAL | Status: DC

## 2022-01-26 MED ORDER — BACLOFEN 10 MG PO TABS: 10.0000 mg | ORAL_TABLET | Freq: Every day | ORAL | Status: DC

## 2022-01-26 MED ORDER — LEVOFLOXACIN 250 MG PO TABS: 250.0000 mg | ORAL_TABLET | Freq: Every day | ORAL | Status: AC

## 2022-01-26 MED ORDER — ALBUTEROL SULFATE HFA 108 (90 BASE) MCG/ACT IN AERS: 2.0000 | INHALATION_SPRAY | RESPIRATORY_TRACT | 0 refills | Status: AC | PRN

## 2022-01-26 MED ORDER — TRAZODONE HCL 150 MG PO TABS: 150.0000 mg | ORAL_TABLET | Freq: Every evening | ORAL | 0 refills | Status: AC | PRN

## 2022-01-26 MED ORDER — GABAPENTIN 300 MG PO CAPS: 300.0000 mg | ORAL_CAPSULE | Freq: Every day | ORAL | 0 refills | Status: AC

## 2022-01-26 MED ORDER — IPRATROPIUM-ALBUTEROL 0.5-2.5 (3) MG/3ML IN SOLN
3.0000 mL | Freq: Two times a day (BID) | RESPIRATORY_TRACT | Status: DC
  Filled 2022-01-26: qty 3

## 2022-01-26 NOTE — Progress Notes (Signed)
Physical Therapy Treatment Patient Details Name: Vincent Hines MRN: [id number] DOB: 1940/09/18 Today's Date: 01/26/2022   History of Present Illness Vincent Hines is a 81 y.o. male with a history of CLL in remission, asthma, diabetes, bronchiectasis who presents from home for syncope.  Patient reports being in his usual state of health when he went to bed last night.  Woke up this morning to go to the bathroom.  He felt dizzy and syncopized.  Patient reports falling backwards with positive LOC which was witnessed by family member for about 2 minutes.    PT Comments    Pt seen for PT tx with pt agreeable. Pt completes bed mobility with mod I. Pt ambulates 2 laps with SPC with improving weight shift L<>R & negotiates flight of stairs with 1 rail & SPC with step to pattern & supervision. Pt is making good progress with functional mobility & improved balance.    Recommendations for follow up therapy are one component of a multi-disciplinary discharge planning process, led by the attending physician.  Recommendations may be updated based on patient status, additional functional criteria and insurance authorization.  Follow Up Recommendations  No PT follow up     Assistance Recommended at Discharge Intermittent Supervision/Assistance  Patient can return home with the following A little help with bathing/dressing/bathroom   Equipment Recommendations  None recommended by PT    Recommendations for Other Services       Precautions / Restrictions Precautions Precautions: Fall Restrictions Weight Bearing Restrictions: No     Mobility  Bed Mobility Overal bed mobility: Modified Independent Bed Mobility: Supine to Sit     Supine to sit: Modified independent (Device/Increase time)          Transfers Overall transfer level: Modified independent Equipment used: Straight cane Transfers: Sit to/from Stand Sit to Stand: Modified independent (Device/Increase time)                 Ambulation/Gait Ambulation/Gait assistance: Supervision Gait Distance (Feet): 500 Feet Assistive device: Straight cane         General Gait Details: improving weight shift L&R   Stairs Stairs: Yes Stairs assistance: Supervision Stair Management: One rail Left, Step to pattern (L ascending rail & SPC) Number of Stairs: 14     Wheelchair Mobility    Modified Rankin (Stroke Patients Only)       Balance Overall balance assessment: Needs assistance   Sitting balance-Leahy Scale: Good     Standing balance support: During functional activity, No upper extremity supported Standing balance-Leahy Scale: Fair                              Cognition Arousal/Alertness: Awake/alert Behavior During Therapy: WFL for tasks assessed/performed Overall Cognitive Status: Within Functional Limits for tasks assessed                                          Exercises      General Comments General comments (skin integrity, edema, etc.): 109 bpm after gait, SpO2 97% on room air      Pertinent Vitals/Pain Pain Assessment Pain Assessment: No/denies pain    Home Living                          Prior Function  PT Goals (current goals can now be found in the care plan section) Acute Rehab PT Goals Patient Stated Goal: Pt stated they would like increase overall strength and balance to return to independence with ADLs PT Goal Formulation: With patient Time For Goal Achievement: 02/07/22 Potential to Achieve Goals: Good Progress towards PT goals: Progressing toward goals    Frequency    Min 2X/week      PT Plan Current plan remains appropriate    Co-evaluation              AM-PAC PT "6 Clicks" Mobility   Outcome Measure  Help needed turning from your back to your side while in a flat bed without using bedrails?: None Help needed moving from lying on your back to sitting on the side of a flat bed without using  bedrails?: None Help needed moving to and from a bed to a chair (including a wheelchair)?: None Help needed standing up from a chair using your arms (e.g., wheelchair or bedside chair)?: None Help needed to walk in hospital room?: A Little Help needed climbing 3-5 steps with a railing? : A Little 6 Click Score: 22    End of Session   Activity Tolerance: Patient tolerated treatment well Patient left: in bed;with call bell/phone within reach;with nursing/sitter in room Nurse Communication: Mobility status PT Visit Diagnosis: Other abnormalities of gait and mobility (R26.89);Muscle weakness (generalized) (M62.81);Unsteadiness on feet (R26.81)     Time: 0902-0910 PT Time Calculation (min) (ACUTE ONLY): 8 min  Charges:  $Therapeutic Activity: 8-22 mins                     Lavone Nian, PT, DPT 01/26/22, 12:47 PM  Waunita Schooner 01/26/2022, 12:44 PM

## 2022-01-26 NOTE — Plan of Care (Signed)

## 2022-01-26 NOTE — Progress Notes (Signed)
Met with the patient in the room at the bedside He is independent at home Has a cane at home stated he does not need additional DME No HH needs

## 2022-01-26 NOTE — Progress Notes (Signed)
  Echocardiogram 2D Echocardiogram has been performed.  Vincent Hines M 01/26/2022, 9:07 AM

## 2022-01-26 NOTE — Discharge Summary (Addendum)
Physician Discharge Summary   Patient: Vincent Hines MRN: [id number] DOB: 1941/03/24  Admit date:     01/24/2022  Discharge date: 01/26/22  Discharge Physician: Karie Kirks   PCP: Dion Body, MD   Recommendations at discharge:    Discharge to home Follow up with PCP in 7-10 days. Avoid "gummies". Follow up with urology - call for appointment. Follow up with neurology - call for appointment.  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 [facility] Course: No notes on file  Assessment and Plan: * Syncope Etiology is not clear.  Likely multifactorial etiology.  Patient states that he took CBD gummy.  Per his daughter, patient is not using his cane.  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  No unilateral neurodeficit on physical examination.  Low suspicions of stroke.  Patient denies any chest pain.  CT angiogram negative for PE.  Recent vitamin B12 level was above normal.  -Placed on telemetry bed for position -Frequent neurochecks -PT/OT -Echocardiogram demonstrated EF of 60-65%, essentially normal.  Syncope likely due to combination of dehydration and Flomax with consumption of CBD "gummies". The patient is urged to make sure he is drinking enough water. He is also referred to urology. He has BPH. It sounds as though he has previously been treated with TURP in [place]. He had relief for a while and is now symptomatic again. The flomax helps, but leads to orthostatic hypotension. A  referral has been made to [facility] to see if he would be benefited by another procedure to improve his ability to pass urine without pharmaceutical assistance.  Fall at home, initial encounter -fall precaution -PT/OT  Aspiration pneumonia ([place]) Possible aspiration pneumonia.  Patient has oxygen desaturation to 88%, has 2 L new oxygen requirement. -The patient has had 3 days of IV Unasyn -Converted to Augmentin for discharge -Bronchodilators -SLP - No risk of aspiration. Regular diet and general aspiration precautions recommended Including slowing down when eating and drinking as well as general GERD precautions.  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  WBC 8.3.  Patient is receiving monthly IVIG in [facility].  Next dose will be on 01/30/2022.  Acute renal failure superimposed on stage 3a chronic kidney disease ([place]) Recent baseline creatinine 1.13 on 01/02/2022.  His creatinine is 1.50, BUN 21. -Hold naproxen -IV fluid as above -Avoid renal toxic medications  HSV-2 infection - Continue Valtrex  Thrombocytopenia (HCC) Platelet of 133.  Mental status normal. -Check LDH --> 126 normal -Check peripheral smear  Chronic diastolic CHF (congestive heart failure) (HCC) Echocardiogram 01/26/2022 demonstrated an LV EF of 60-65% with Grade 1 diastolic dysfunction.   I have seen and examined this patient myself. I have spent 38 minutes in his evaluation and care.  Consultants: None Procedures performed: None  Disposition: Home Diet recommendation:  Discharge Diet Orders (From admission, onward)     Start     Ordered   01/26/22 0000  Diet -  low sodium heart healthy        01/26/22 1235           Regular diet DISCHARGE MEDICATION: Allergies as of 01/26/2022       Reactions   Meperidine    Other reaction(s): Other (See Comments), Other (See Comments), Other  (See Comments) Hypotension "irregular HR" reported by pt Low BP Hypotension Low BP   Shellfish Allergy Shortness Of Breath   Cerner Allergy Text Annotation: Shrimp "throat closing" per pt   Sulfamethoxazole-trimethoprim Anaphylaxis   Azithromycin    Other reaction(s): Dizziness   Lisinopril Other (See Comments)   Frequent urination Other reaction(s): Other (See Comments) Frequent urination        Medication List     STOP taking these medications    naproxen 500 MG tablet Commonly known as: NAPROSYN       TAKE these medications    albuterol 108 (90 Base) MCG/ACT inhaler Commonly known as: VENTOLIN HFA Inhale 2 puffs into the lungs every 4 (four) hours as needed for wheezing or shortness of breath.   amoxicillin-clavulanate 875-125 MG tablet Commonly known as: AUGMENTIN Take 1 tablet by mouth every 12 (twelve) hours for 7 days.   baclofen 20 MG tablet Commonly known as: LIORESAL Take 20 mg by mouth at bedtime. What changed: Another medication with the same name was added. Make sure you understand how and when to take each.   baclofen 10 MG tablet Commonly known as: LIORESAL Take 1 tablet (10 mg total) by mouth at bedtime. What changed: You were already taking a medication with the same name, and this prescription was added. Make sure you understand how and when to take each.   fluticasone 50 MCG/ACT nasal spray Commonly known as: FLONASE SHAKE LQ AND U 1 SPR IEN QD What changed: Another medication with the same name was removed. Continue taking this medication, and follow the directions you see here.   gabapentin 300 MG capsule Commonly known as: NEURONTIN Take 1 capsule (300 mg total) by mouth at bedtime. What changed:  how much to take when to take this   INSULIN SYRINGE .3CC/31GX5/16" 31G X 5/16" 0.3 ML Misc Use as needed with Trimix up to 3 times per week.   ipratropium 0.06 % nasal spray Commonly known as: ATROVENT U 2 SPRAYS IEN BID    levofloxacin 250 MG tablet Commonly known as: LEVAQUIN Take 1 tablet (250 mg total) by mouth daily. Start taking on: February 26, 2023 What changed:  when to take this These instructions start on February 26, 2023. If you are unsure what to do until then, ask your doctor or other care provider.   montelukast 10 MG tablet Commonly known as: SINGULAIR montelukast 10 mg tablet  TAKE 1 TABLET BY MOUTH EVERY DAY   NONFORMULARY OR COMPOUNDED ITEM Trimix (30/1/10)-(Pap/Phent/PGE)  Dosage: Inject 0.01 cc per injection. Can titrate 0.05-0.1cc to achieve erection lasting 30-34mns   Vial 180m Qty #10 Refills 6  CuChristianaax [id number] pantoprazole 40 MG tablet Commonly known as: PROTONIX Take 40 mg by mouth daily.   pilocarpine 5 MG tablet Commonly known as: SALAGEN Take 5 mg by mouth 3 (three) times daily.   predniSONE 20 MG tablet Commonly known as: DELTASONE Take 1 tablet (20 mg total) by mouth 3 (three) times daily. As directed (once monthly with IV IG, 7 tablets over 3 days) Start taking on: January 27, 2022   tamsulosin 0.4 MG Caps capsule Commonly known  as: FLOMAX Take 0.4 mg by mouth daily.   Testosterone 1.62 % Gel Apply 4 Pump topically daily.   tobramycin (PF) 300 MG/5ML nebulizer solution Commonly known as: TOBI INHALE THE CONTENTS OF 1 AMPULE VIA NEBULIZER TWICE DAILY FOR 28 DAYS   traZODone 100 MG tablet Commonly known as: DESYREL Take 150 mg by mouth at bedtime as needed for sleep. What changed: Another medication with the same name was added. Make sure you understand how and when to take each.   traZODone 150 MG tablet Commonly known as: DESYREL Take 1 tablet (150 mg total) by mouth at bedtime as needed for sleep. What changed: You were already taking a medication with the same name, and this prescription was added. Make sure you understand how and when to take each.   valACYclovir 500 MG tablet Commonly known as: VALTREX Take  1,000 mg by mouth 2 (two) times daily.        Follow-up Information     Dion Body, MD Follow up in 1 week(s).   Specialty: Family Medicine Contact information: [place] [address] [facility] Follow up in 1 month(s).   Why: Tremors. Contact information: [place] [facility] Follow up in 2 week(s).   Specialty: Urology Why: Pt with BPH. Previous TURP. Now again symptomatic. On Flomax, but with orthostatic hypotension causing fall risk. Contact information: [address], [place] [phone]               Discharge Exam: Danley Danker Weights   01/24/22 0403  Weight: 84.8 kg   Vitals:   01/26/22 0911 01/26/22 1145  BP: (!) 145/71 (!) 157/66  Pulse: 86 69  Resp:  18  Temp: 98.1 F (36.7 C) 98.1 F (36.7 C)  SpO2: 97% 96%   Exam:  Constitutional:  The patient is awake, alert, and oriented x 3. No acute distress. Respiratory:  No increased work of breathing. No wheezes, rales, or rhonchi No tactile fremitus Cardiovascular:  Regular rate and rhythm No murmurs, ectopy, or gallups. No lateral PMI. No thrills. Abdomen:  Abdomen is soft, non-tender, non-distended No hernias, masses, or organomegaly Normoactive bowel sounds.  Musculoskeletal:  No cyanosis, clubbing, or edema Skin:  No rashes, lesions, ulcers palpation of skin: no induration or nodules Neurologic:  CN 2-12 intact Sensation all 4 extremities intact Psychiatric:  Mental status Mood, affect appropriate Orientation to person, place, time  judgment and insight appear intact   Condition at discharge: fair  The results of significant diagnostics from this hospitalization (including imaging, microbiology, ancillary and laboratory) are listed below for  reference.   Imaging Studies: DG Chest 2 View  Result Date: 01/24/2022 CLINICAL DATA:  81 year old male status post fall backwards at home. Pain. EXAM: CHEST - 2 VIEW COMPARISON:  Chest CT 05/11/2020. FINDINGS: Seated AP and lateral views of the chest at 0442 hours. Lower lung volumes. Stable cardiac size and mediastinal contours. No definite cardiomegaly. Visualized tracheal air column is within normal limits. Azygous fissure as seen by CT. No pneumothorax, pulmonary edema, pleural effusion or consolidation. Patchy bibasilar atelectasis. Osteopenia. No acute osseous abnormality identified. Paucity of bowel gas in the upper abdomen. IMPRESSION: Low lung volumes with bibasilar atelectasis. Electronically Signed   By: Genevie Ann M.D.   On: 01/24/2022 04:58  DG Thoracic Spine 2 View  Result Date: 01/24/2022 CLINICAL DATA:  81 year old male status post fall backwards at home. Pain. EXAM: THORACIC SPINE 2 VIEWS COMPARISON:  Chest radiographs today. Cervical spine CT today. Chest CT 05/11/2020. FINDINGS: Normal thoracic segmentation on the comparison CT. Cervicothoracic junction alignment better demonstrated on the cervical spine CT today. Mild chronic levoconvex lower thoracic scoliosis is stable. Chronic interbody ankylosis T9 through T11 or T12 from flowing endplate osteophytes. Stable thoracic vertebral height and alignment since 2021. Relatively preserved disc spaces. Negative visible chest and abdominal visceral contours. IMPRESSION: No acute osseous abnormality identified in the thoracic spine. Electronically Signed   By: Genevie Ann M.D.   On: 01/24/2022 05:01   CT Head Wo Contrast  Result Date: 01/24/2022 CLINICAL DATA:  81 year old male status post fall backwards, striking head and neck on wall with loss of consciousness. Neck pain. EXAM: CT HEAD WITHOUT CONTRAST TECHNIQUE: Contiguous axial images were obtained from the base of the skull through the vertex without intravenous contrast. RADIATION DOSE  REDUCTION: This exam was performed according to the departmental dose-optimization program which includes automated exposure control, adjustment of the mA and/or kV according to patient size and/or use of iterative reconstruction technique. COMPARISON:  Brain MRI 11/30/2020. FINDINGS: Brain: Cerebral volume is within normal limits for age. No midline shift, ventriculomegaly, mass effect, evidence of mass lesion, intracranial hemorrhage or evidence of cortically based acute infarction. Gray-white matter differentiation within normal limits for age throughout the brain, stable to the MRI last month. Vascular: Mild Calcified atherosclerosis at the skull base. No suspicious intracranial vascular hyperdensity. Skull: No skull fracture identified. Sinuses/Orbits: Previous paranasal sinus surgery. Mild to moderate maxillary sinus mucosal thickening has not significantly changed. No acute sinus fluid level. Tympanic cavities and mastoids are well aerated. Other: No discrete scalp hematoma. Postoperative changes to both globes, otherwise negative orbits soft tissues. IMPRESSION: No acute traumatic injury to the head. Normal for age non contrast CT appearance of the brain. Electronically Signed   By: Genevie Ann M.D.   On: 01/24/2022 04:46   CT Angio Chest PE W and/or Wo Contrast  Result Date: 01/24/2022 CLINICAL DATA:  81 year old male with history of fall with loss of consciousness for approximately 2 minutes. Evaluate for pulmonary embolism. EXAM: CT ANGIOGRAPHY CHEST WITH CONTRAST TECHNIQUE: Multidetector CT imaging of the chest was performed using the standard protocol during bolus administration of intravenous contrast. Multiplanar CT image reconstructions and MIPs were obtained to evaluate the vascular anatomy. RADIATION DOSE REDUCTION: This exam was performed according to the departmental dose-optimization program which includes automated exposure control, adjustment of the mA and/or kV according to patient size  and/or use of iterative reconstruction technique. CONTRAST:  63m OMNIPAQUE IOHEXOL 350 MG/ML SOLN COMPARISON:  Chest CT 05/11/2020. FINDINGS: Cardiovascular: There are no filling defects within the pulmonary arterial tree to suggest pulmonary embolism. Heart size is normal. There is no significant pericardial fluid, thickening or pericardial calcification. There is aortic atherosclerosis, as well as atherosclerosis of the great vessels of the mediastinum and the coronary arteries, including calcified atherosclerotic plaque in the left anterior descending and right coronary arteries. Mediastinum/Nodes: No pathologically enlarged mediastinal or hilar lymph nodes. Small to moderate hiatal hernia. No axillary lymphadenopathy. Lungs/Pleura: Patchy areas of ground-glass attenuation, thickening of the peribronchovascular interstitium and peribronchovascular airspace consolidation noted in the lung bases bilaterally, most severe in the left lower lobe where there is extensive opacification of bronchi, and some cylindrical bronchiectasis. No pleural effusions. Azygous lobe (normal anatomical variant)  incidentally noted. Upper Abdomen: Unremarkable. Musculoskeletal: There are no aggressive appearing lytic or blastic lesions noted in the visualized portions of the skeleton. Review of the MIP images confirms the above findings. IMPRESSION: 1. No evidence of pulmonary embolism. 2. Multilobar pneumonia, as above. Given the basilar and dependent distribution, along with the opacification of multiple bronchi and areas of bronchiectasis, clinical correlation for signs and symptoms of recurrent aspiration is recommended. Speech therapy swallow study should be considered. 3. Aortic atherosclerosis, in addition to two-vessel coronary artery disease. 4. Small to moderate-sized hiatal hernia. Aortic Atherosclerosis (ICD10-I70.0). Electronically Signed   By: Vinnie Langton M.D.   On: 01/24/2022 06:24   CT Cervical Spine Wo  Contrast  Result Date: 01/24/2022 CLINICAL DATA:  81 year old male status post fall backwards, striking head and neck on wall with loss of consciousness. Neck pain. EXAM: CT CERVICAL SPINE WITHOUT CONTRAST TECHNIQUE: Multidetector CT imaging of the cervical spine was performed without intravenous contrast. Multiplanar CT image reconstructions were also generated. RADIATION DOSE REDUCTION: This exam was performed according to the departmental dose-optimization program which includes automated exposure control, adjustment of the mA and/or kV according to patient size and/or use of iterative reconstruction technique. COMPARISON:  Head CT today reported separately. Brain MRI 11/30/2020. FINDINGS: Alignment: Straightening of cervical lordosis. Mild anterolisthesis of C7 on T1 appears to be degenerative in nature. Subtle similar anterolisthesis of C3 on C4. Skull base and vertebrae: Visualized skull base is intact. No atlanto-occipital dissociation. C1 and C2 appear intact and aligned, with advanced degeneration described below. Small ossific fragments lateral to the right C1-C2 articulation (coronal image 24) do not appear acute. No acute osseous abnormality identified. Soft tissues and spinal canal: No prevertebral fluid or swelling. No visible canal hematoma. Negative visible noncontrast neck soft tissues. Disc levels: Advanced anterior C1-C2 joint space loss with bulky osteophytosis and probable developing ankylosis. Widespread bilateral cervical facet hypertrophy. Advanced disc and endplate degeneration Z7-Q7 through C6-C7. Subsequent mild degenerative spinal stenosis suspected at C4-C5. Upper chest: Mild degenerative appearing anterolisthesis of T1 on T2. Grossly intact visible upper thoracic vertebrae. Lung apices are negative; right side azygous fissure (normal variant). Negative noncontrast thoracic inlet soft tissues. IMPRESSION: 1. No acute traumatic injury identified in the cervical spine. 2. Advanced  cervical spine degeneration, especially at C1-C2 and C4-C5 with mild degenerative spinal stenosis suspected at the latter. Electronically Signed   By: Genevie Ann M.D.   On: 01/24/2022 04:51   ECHOCARDIOGRAM COMPLETE  Result Date: 01/26/2022    ECHOCARDIOGRAM REPORT   Patient Name:   BURNIE THERIEN Date of Exam: 01/26/2022 Medical Rec #:  [id number]  Height:       71.0 in Accession #:    [id number] Weight:       187.0 lb Date of Birth:  10-04-1940  BSA:          2.049 m Patient Age:    81 years   BP:           122/53 mmHg Patient Gender: M          HR:           81 bpm. Exam Location:  Inpatient Procedure: 2D Echo, Cardiac Doppler and Color Doppler Indications:     Syncope R55  History:         Patient has prior history of Echocardiogram examinations, most                  recent 09/30/2013.  Sonographer:  Darlina Sicilian RDCS Referring Phys:  [id number] Arianah Torgeson Diagnosing Phys: Isaias Cowman MD IMPRESSIONS  1. Left ventricular ejection fraction, by estimation, is 60 to 65%. The left ventricle has normal function. The left ventricle has no regional wall motion abnormalities. There is mild left ventricular hypertrophy. Left ventricular diastolic parameters are consistent with Grade I diastolic dysfunction (impaired relaxation).  2. Right ventricular systolic function is normal. The right ventricular size is normal.  3. The mitral valve is normal in structure. Trivial mitral valve regurgitation. No evidence of mitral stenosis.  4. The aortic valve is normal in structure. Aortic valve regurgitation is not visualized. No aortic stenosis is present.  5. The inferior vena cava is normal in size with greater than 50% respiratory variability, suggesting right atrial pressure of 3 mmHg. FINDINGS  Left Ventricle: Left ventricular ejection fraction, by estimation, is 60 to 65%. The left ventricle has normal function. The left ventricle has no regional wall motion abnormalities. The left ventricular internal cavity size was normal  in size. There is  mild left ventricular hypertrophy. Left ventricular diastolic parameters are consistent with Grade I diastolic dysfunction (impaired relaxation). Right Ventricle: The right ventricular size is normal. No increase in right ventricular wall thickness. Right ventricular systolic function is normal. Left Atrium: Left atrial size was normal in size. Right Atrium: Right atrial size was normal in size. Pericardium: There is no evidence of pericardial effusion. Mitral Valve: The mitral valve is normal in structure. Trivial mitral valve regurgitation. No evidence of mitral valve stenosis. Tricuspid Valve: The tricuspid valve is normal in structure. Tricuspid valve regurgitation is trivial. No evidence of tricuspid stenosis. Aortic Valve: The aortic valve is normal in structure. Aortic valve regurgitation is not visualized. No aortic stenosis is present. Pulmonic Valve: The pulmonic valve was normal in structure. Pulmonic valve regurgitation is not visualized. No evidence of pulmonic stenosis. Aorta: The aortic root is normal in size and structure. Venous: The inferior vena cava is normal in size with greater than 50% respiratory variability, suggesting right atrial pressure of 3 mmHg. IAS/Shunts: No atrial level shunt detected by color flow Doppler.  LEFT VENTRICLE PLAX 2D LVIDd:         4.20 cm   Diastology LVIDs:         2.90 cm   LV e' medial:    7.51 cm/s LV PW:         1.00 cm   LV E/e' medial:  9.4 LV IVS:        1.80 cm   LV e' lateral:   9.25 cm/s LVOT diam:     2.60 cm   LV E/e' lateral: 7.6 LV SV:         109 LV SV Index:   53 LVOT Area:     5.31 cm  RIGHT VENTRICLE RV S prime:     15.00 cm/s TAPSE (M-mode): 3.0 cm LEFT ATRIUM             Index        RIGHT ATRIUM           Index LA diam:        3.90 cm 1.90 cm/m   RA Area:     15.60 cm LA Vol (A2C):   50.7 ml 24.72 ml/m  RA Volume:   36.50 ml  17.81 ml/m LA Vol (A4C):   35.0 ml 17.08 ml/m LA Biplane Vol: 39.6 ml 19.32 ml/m  AORTIC VALVE  LVOT Vmax:   97.90 cm/s  LVOT Vmean:  63.300 cm/s LVOT VTI:    0.206 m  AORTA Ao Root diam: 3.40 cm Ao Asc diam:  3.70 cm MITRAL VALVE MV Area (PHT): 5.02 cm    SHUNTS MV Decel Time: 151 msec    Systemic VTI:  0.21 m MV E velocity: 70.30 cm/s  Systemic Diam: 2.60 cm MV A velocity: 95.50 cm/s MV E/A ratio:  0.74 Isaias Cowman MD Electronically signed by Isaias Cowman MD Signature Date/Time: 01/26/2022/11:51:51 AM    Final     Microbiology: Results for orders placed or performed during the hospital encounter of 01/24/22  SARS Coronavirus 2 by RT PCR (hospital order, performed in [facility] hospital lab) *cepheid single result test* Anterior Nasal Swab     Status: None   Collection Time: 01/24/22 12:15 PM   Specimen: Anterior Nasal Swab  Result Value Ref Range Status   SARS Coronavirus 2 by RT PCR NEGATIVE NEGATIVE Final    Comment: (NOTE) SARS-CoV-2 target nucleic acids are NOT DETECTED.  The SARS-CoV-2 RNA is generally detectable in upper and lower respiratory specimens during the acute phase of infection. The lowest concentration of SARS-CoV-2 viral copies this assay can detect is 250 copies / mL. A negative result does not preclude SARS-CoV-2 infection and should not be used as the sole basis for treatment or other patient management decisions.  A negative result may occur with improper specimen collection / handling, submission of specimen other than nasopharyngeal swab, presence of viral mutation(s) within the areas targeted by this assay, and inadequate number of viral copies (<250 copies / mL). A negative result must be combined with clinical observations, patient history, and epidemiological information.  Fact Sheet for Patients:   https://www.patel.info/  Fact Sheet for Healthcare Providers: https://hall.com/  This test is not yet approved or  cleared by the [place] FDA and has been authorized for detection and/or  diagnosis of SARS-CoV-2 by FDA under an Emergency Use Authorization (EUA).  This EUA will remain in effect (meaning this test can be used) for the duration of the COVID-19 declaration under Section 564(b)(1) of the Act, 21 U.S.C. section 360bbb-3(b)(1), unless the authorization is terminated or revoked sooner.  Performed at [facility], [address] [place], [place] [address]     Labs: CBC: Recent Labs  Lab 01/24/22 0406 01/25/22 0318  WBC 8.3 4.3  NEUTROABS 6.8  --   HGB 14.1 12.1*  HCT 43.0 35.1*  MCV 98.6 95.9  PLT 133* 165*   Basic Metabolic Panel: Recent Labs  Lab 01/24/22 0406 01/25/22 0318  NA 139 139  K 3.6 3.6  CL 103 108  CO2 30 24  GLUCOSE 161* 179*  BUN 21 22  CREATININE 1.50* 1.12  CALCIUM 9.2 8.2*   Liver Function Tests: No results for input(s): AST, ALT, ALKPHOS, BILITOT, PROT, ALBUMIN in the last 168 hours. CBG: No results for input(s): GLUCAP in the last 168 hours.  Discharge time spent: greater than 30 minutes.  Signed: Madisynn Plair, DO [facility] 01/26/2022

## 2022-01-26 NOTE — Progress Notes (Signed)
Reviewed discharge instructions. Pt verbalized understanding. Attending MD also reviewed and answered medical questions with pt.

## 2022-01-26 NOTE — Assessment & Plan Note (Signed)
Echocardiogram 01/26/2022 demonstrated an LV EF of 60-65% with Grade 1 diastolic dysfunction.

## 2022-01-26 NOTE — Progress Notes (Signed)
Pharmacy Antibiotic Note  Vincent Hines is a 81 y.o. male admitted on 01/24/2022 with aspiration pneumonia.  Pharmacy has been consulted for unasyn dosing.  -hx CLL in remission -recent tx for PNA (augmentin 5/11-5/25/23 per pulmonolgy office visit note 01/11/22 in careeverywhere), also noted: hx Inhaled tobramycin 28 days on then off, and levaquin Mon,Wed,Fri  Plan: Unasyn 3 gm IV q6h     Height: '5\' 11"'$  (180.3 cm) Weight: 84.8 kg (187 lb) IBW/kg (Calculated) : 75.3  Temp (24hrs), Avg:97.9 F (36.6 C), Min:97.4 F (36.3 C), Max:98.1 F (36.7 C)  Recent Labs  Lab 01/24/22 0406 01/25/22 0318  WBC 8.3 4.3  CREATININE 1.50* 1.12     Estimated Creatinine Clearance: 54.2 mL/min (by C-G formula based on SCr of 1.12 mg/dL).    Allergies  Allergen Reactions   Meperidine     Other reaction(s): Other (See Comments), Other (See Comments), Other (See Comments) Hypotension "irregular HR" reported by pt Low BP Hypotension Low BP    Shellfish Allergy Shortness Of Breath    Cerner Allergy Text Annotation: Shrimp "throat closing" per pt    Sulfamethoxazole-Trimethoprim Anaphylaxis   Azithromycin     Other reaction(s): Dizziness   Lisinopril Other (See Comments)    Frequent urination Other reaction(s): Other (See Comments) Frequent urination     Antimicrobials this admission: Unasyn  5/31 >>          Dose adjustments this admission:    Microbiology results:   BCx:     UCx:      Sputum:      MRSA PCR:    Thank you for allowing pharmacy to be a part of this patient's care.  Vansh Reckart A 01/26/2022 10:43 AM

## 2022-05-25 ENCOUNTER — Other Ambulatory Visit: Payer: Self-pay | Admitting: Infectious Diseases

## 2022-05-25 DIAGNOSIS — J479 Bronchiectasis, uncomplicated: Secondary | ICD-10-CM

## 2022-06-05 ENCOUNTER — Ambulatory Visit
Admission: RE | Admit: 2022-06-05 | Discharge: 2022-06-05 | Disposition: A | Discharge status: Home / Self Care | Payer: Medicare Other | Source: Ambulatory Visit | Attending: Infectious Diseases | Admitting: Infectious Diseases

## 2022-06-05 DIAGNOSIS — J479 Bronchiectasis, uncomplicated: Secondary | ICD-10-CM

## 2022-10-05 ENCOUNTER — Other Ambulatory Visit: Payer: Self-pay

## 2022-10-05 DIAGNOSIS — J329 Chronic sinusitis, unspecified: Secondary | ICD-10-CM

## 2022-10-05 DIAGNOSIS — J479 Bronchiectasis, uncomplicated: Secondary | ICD-10-CM

## 2022-10-09 ENCOUNTER — Ambulatory Visit
Admission: RE | Admit: 2022-10-09 | Discharge: 2022-10-09 | Disposition: A | Discharge status: Home / Self Care | Payer: Medicare Other | Source: Ambulatory Visit | Attending: Infectious Diseases | Admitting: Infectious Diseases

## 2022-10-09 DIAGNOSIS — J479 Bronchiectasis, uncomplicated: Secondary | ICD-10-CM | POA: Insufficient documentation

## 2022-10-09 DIAGNOSIS — J329 Chronic sinusitis, unspecified: Secondary | ICD-10-CM | POA: Diagnosis present

## 2023-03-18 ENCOUNTER — Encounter: Payer: Self-pay | Admitting: Family Medicine

## 2023-03-18 DIAGNOSIS — Z8619 Personal history of other infectious and parasitic diseases: Secondary | ICD-10-CM

## 2023-03-19 ENCOUNTER — Other Ambulatory Visit: Payer: Self-pay | Admitting: Family Medicine

## 2023-03-19 DIAGNOSIS — Z8619 Personal history of other infectious and parasitic diseases: Secondary | ICD-10-CM

## 2023-03-19 DIAGNOSIS — R918 Other nonspecific abnormal finding of lung field: Secondary | ICD-10-CM

## 2023-03-25 ENCOUNTER — Other Ambulatory Visit: Payer: Self-pay | Admitting: Family Medicine

## 2023-03-25 DIAGNOSIS — Z8619 Personal history of other infectious and parasitic diseases: Secondary | ICD-10-CM

## 2023-03-26 ENCOUNTER — Other Ambulatory Visit: Payer: Self-pay | Admitting: Family Medicine

## 2023-03-26 DIAGNOSIS — Z8619 Personal history of other infectious and parasitic diseases: Secondary | ICD-10-CM

## 2023-04-04 IMAGING — CT CT CERVICAL SPINE W/O CM
3 of 4 series · 12 of 33 positions shown, 14 images · non-contrast
Comparison: Head CT today reported separately. Brain MRI
11/30/2020.

CLINICAL DATA: 81-year-old male status post fall backwards,
striking head and neck on wall with loss of consciousness. Neck
pain.



[Series 3: c spine soft · axial · 0.41mm/px · z∈[-432,-360]mm · 3 of 92 slices shown]
[im 19/92  soft-tissue]
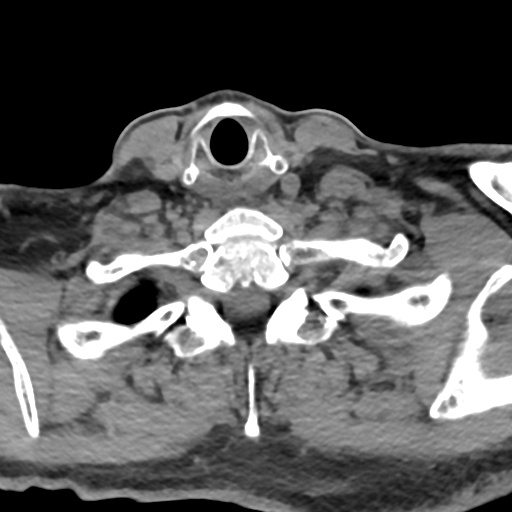
[im 37/92  soft-tissue]
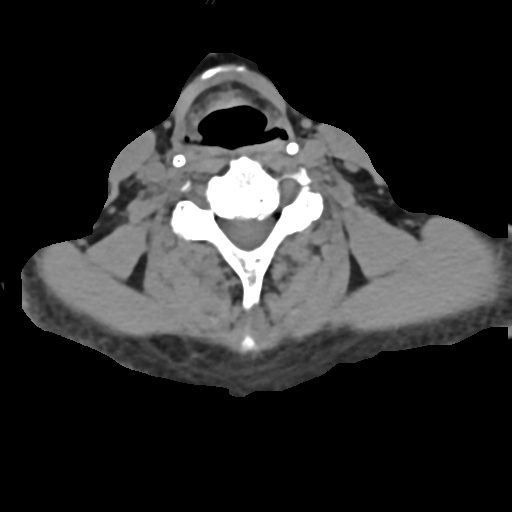
[im 55/92  soft-tissue]
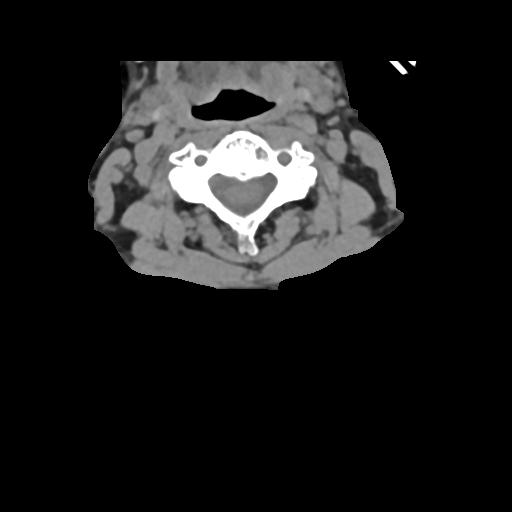

[Series 6: cor bone · coronal · 0.23mm/px · 3 of 62 slices shown]
[im 13/62  bone]
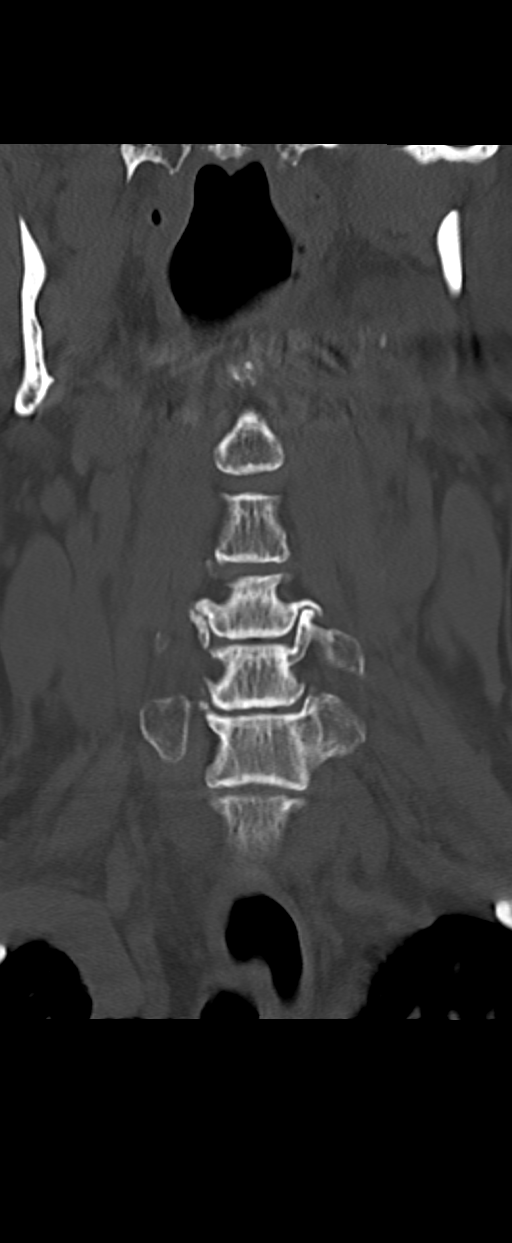
[im 25/62  bone]
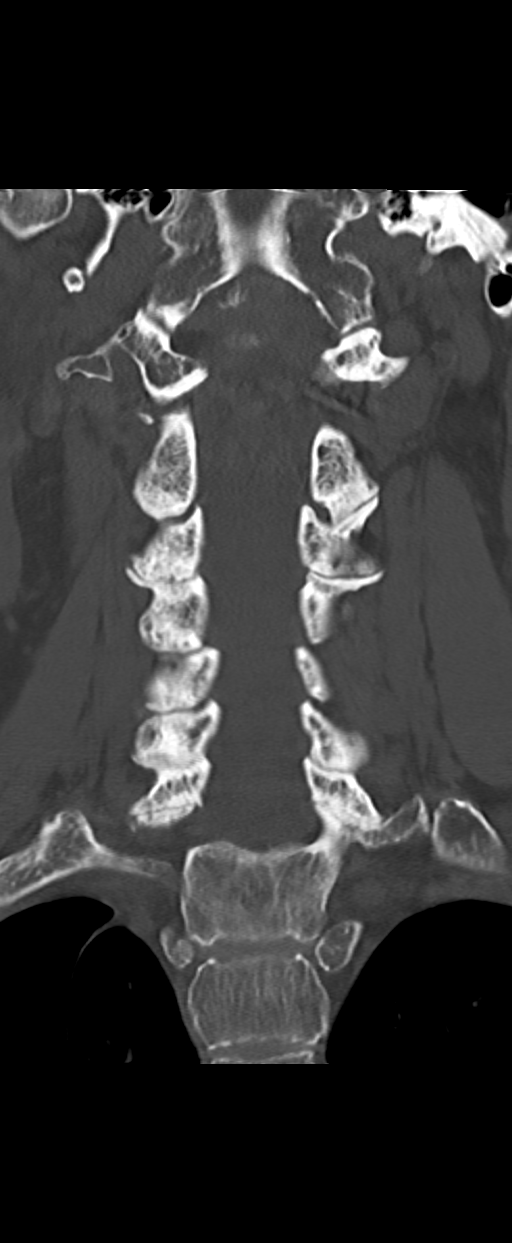
[im 37/62  bone]
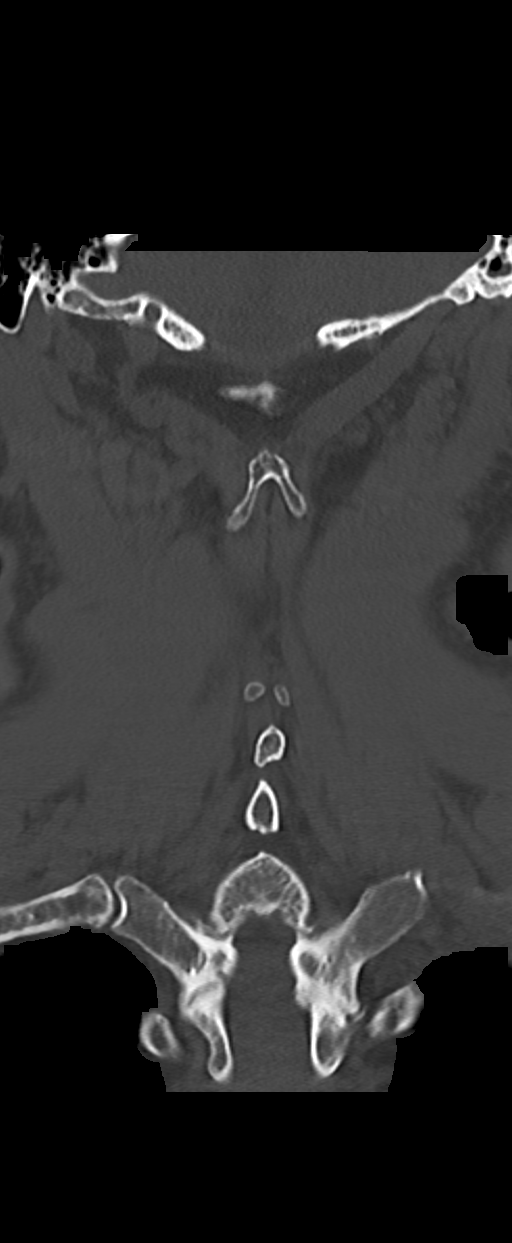

[Series 7: orthogonal axials · axial · 0.23mm/px · z∈[-486,-330]mm · 6 of 122 slices shown, 8 images]
[im 18/122  soft-tissue]
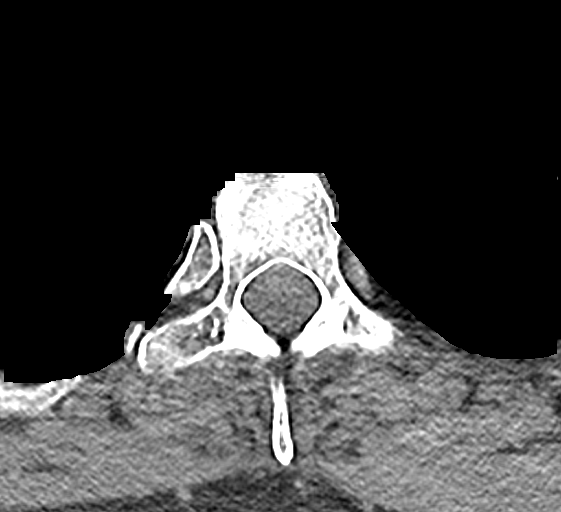
[im 18/122  bone]
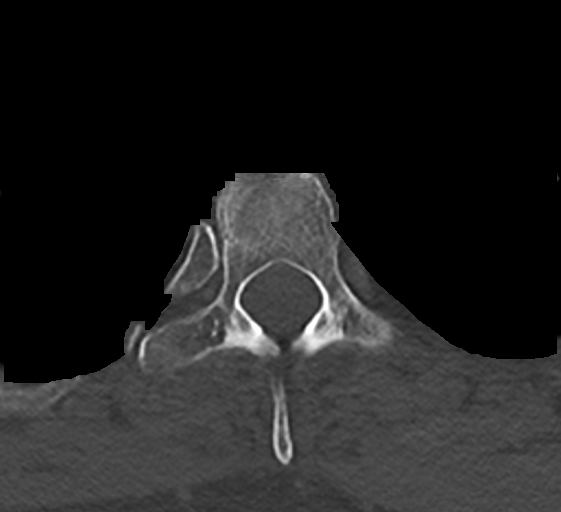
[im 35/122  bone]
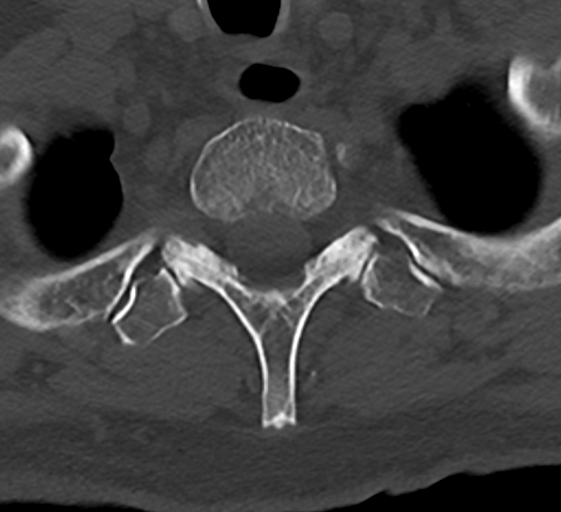
[im 52/122  bone]
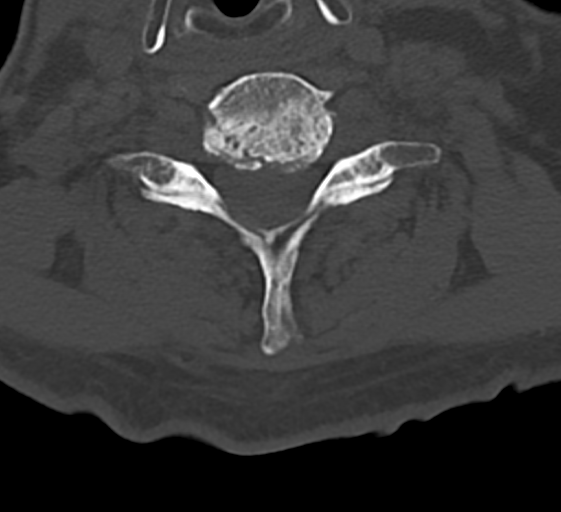
[im 70/122  bone]
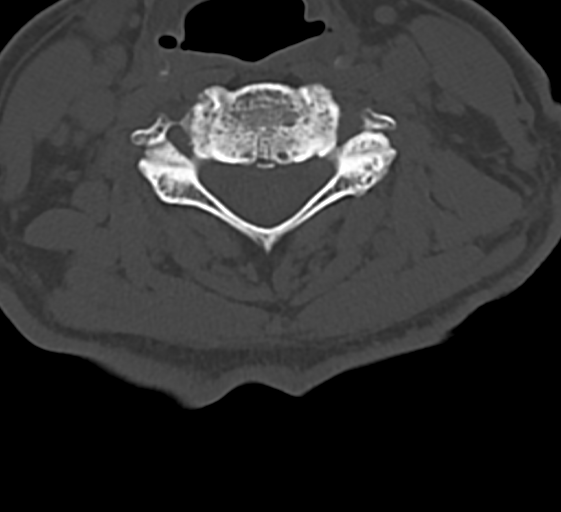
[im 87/122  soft-tissue]
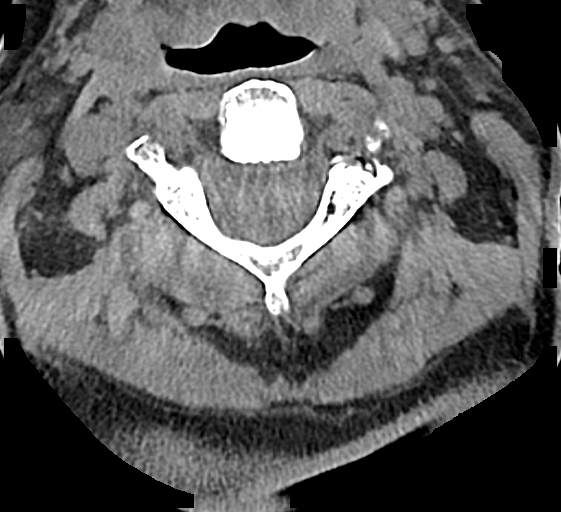
[im 87/122  bone]
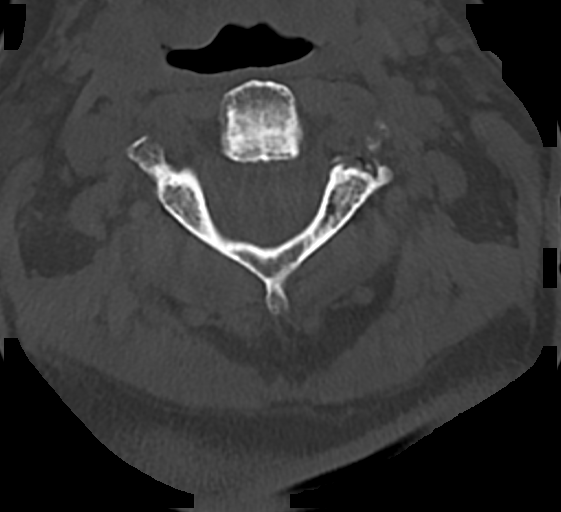
[im 104/122  bone]
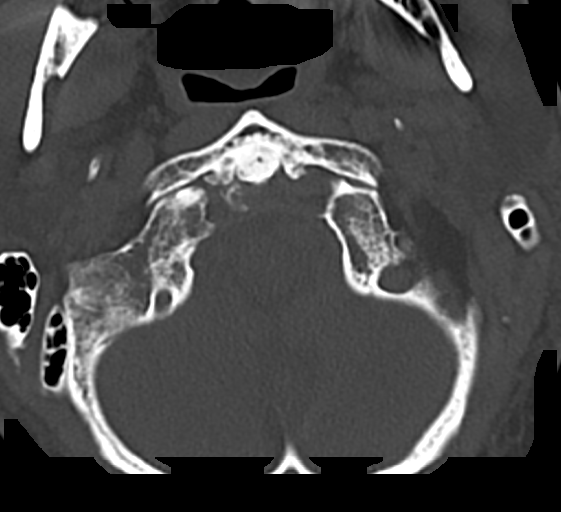

[12 of 33 positions shown; findings below may reference images not displayed]

FINDINGS: Alignment: Straightening of cervical lordosis. Mild anterolisthesis
of C7 on T1 appears to be degenerative in nature. Subtle similar
anterolisthesis of C3 on C4.

Skull base and vertebrae: Visualized skull base is intact. No
atlanto-occipital dissociation. C1 and C2 appear intact and aligned,
with advanced degeneration described below. Small ossific fragments
lateral to the right C1-C2 articulation (coronal image 24) do not
appear acute.

No acute osseous abnormality identified.

Soft tissues and spinal canal: No prevertebral fluid or swelling. No
visible canal hematoma. Negative visible noncontrast neck soft
tissues.

Disc levels: Advanced anterior C1-C2 joint space loss with bulky
osteophytosis and probable developing ankylosis. Widespread
bilateral cervical facet hypertrophy. Advanced disc and endplate
degeneration C4-C5 through C6-C7. Subsequent mild degenerative
spinal stenosis suspected at C4-C5.

Upper chest: Mild degenerative appearing anterolisthesis of T1 on
T2. Grossly intact visible upper thoracic vertebrae. Lung apices are
negative; right side azygous fissure (normal variant). Negative
noncontrast thoracic inlet soft tissues.
IMPRESSION: 1. No acute traumatic injury identified in the cervical spine.
2. Advanced cervical spine degeneration, especially at C1-C2 and
C4-C5 with mild degenerative spinal stenosis suspected at the
latter.

## 2023-04-04 IMAGING — CT CT ANGIO CHEST
2 of 6 series · 17 of 46 positions shown · IV contrast (agent unspecified)
Comparison: Chest CT 05/11/2020.

CLINICAL DATA: 81-year-old male with history of fall with loss of
consciousness for approximately 2 minutes. Evaluate for pulmonary
embolism.

EXAM:
CT ANGIOGRAPHY CHEST WITH CONTRAST
TECHNIQUE: Multidetector CT imaging of the chest was performed using the
standard protocol during bolus administration of intravenous
contrast. Multiplanar CT image reconstructions and MIPs were
obtained to evaluate the vascular anatomy.

[Series 6: thins · axial · 0.71mm/px · z∈[-721,-451]mm · 14 of 423 slices shown]
[im 19/423  lung]
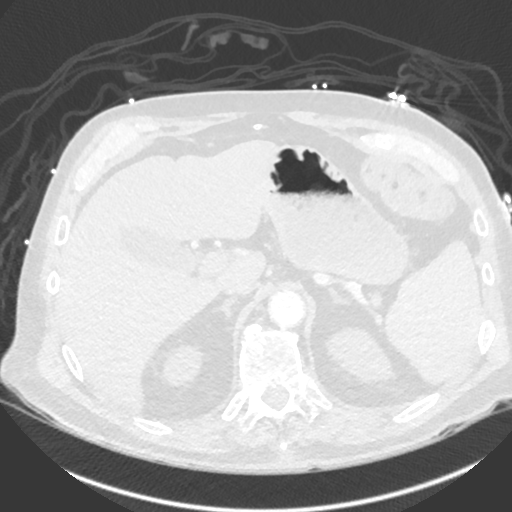
[im 56/423  soft-tissue]
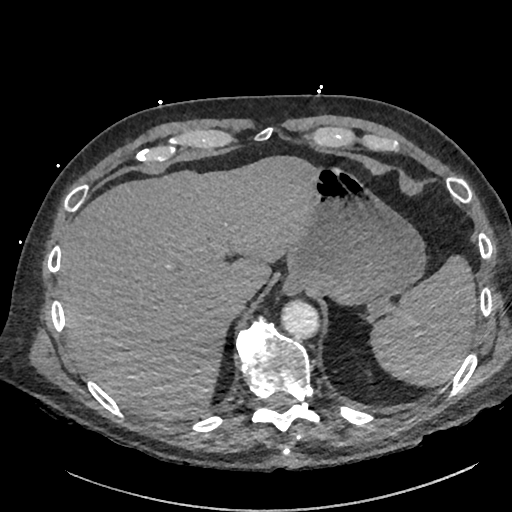
[im 74/423  lung]
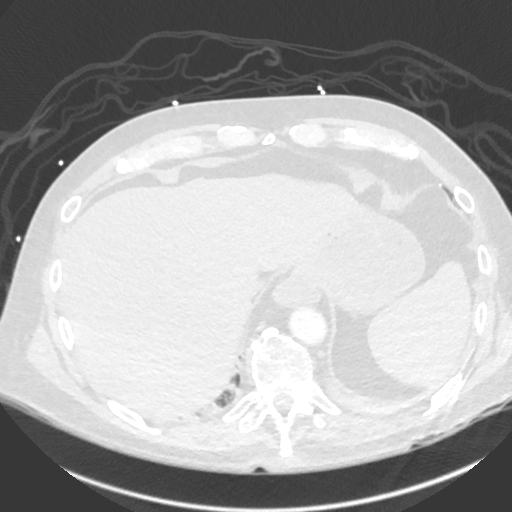
[im 111/423  soft-tissue]
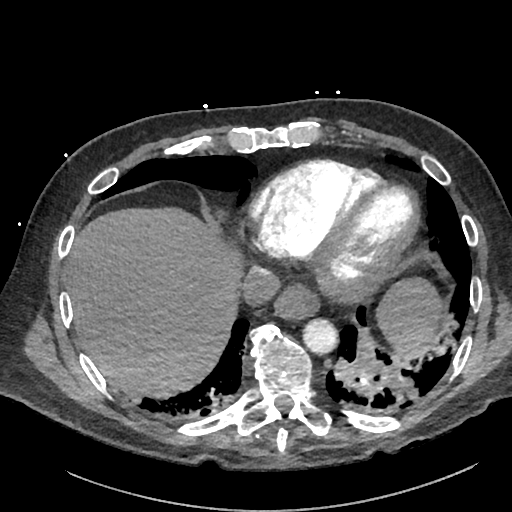
[im 147/423  lung]
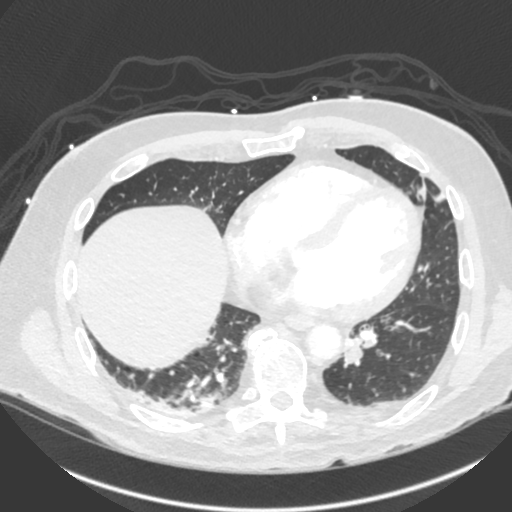
[im 166/423  soft-tissue]
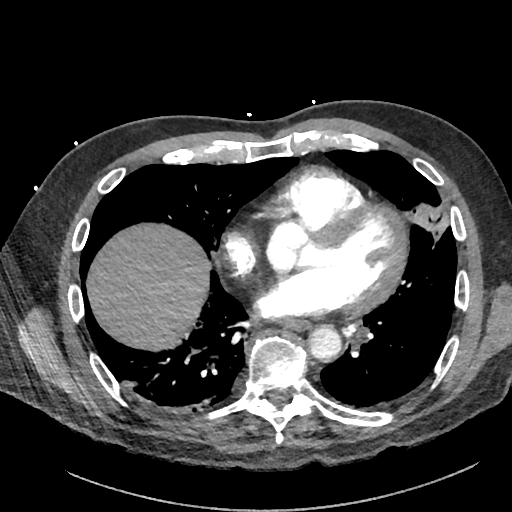
[im 202/423  lung]
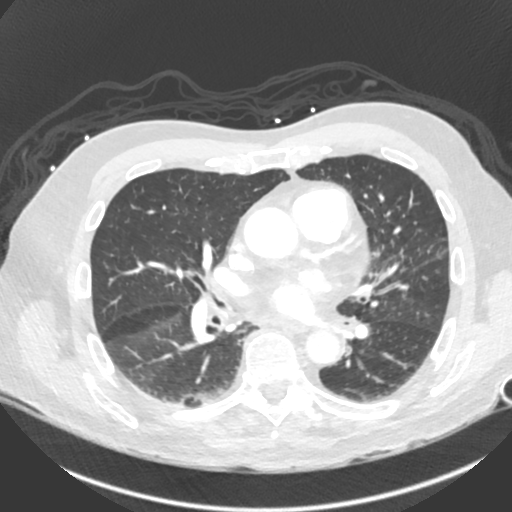
[im 221/423  soft-tissue]
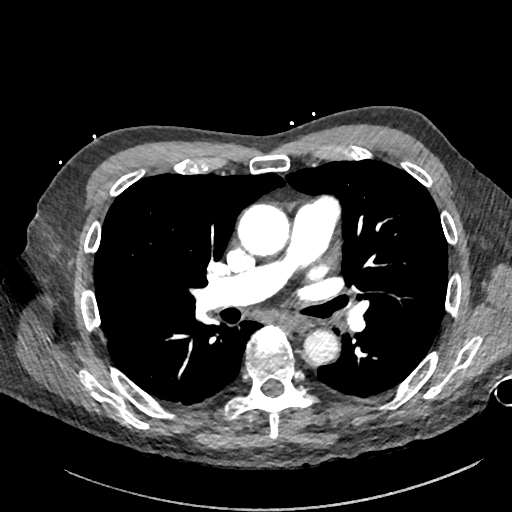
[im 257/423  lung]
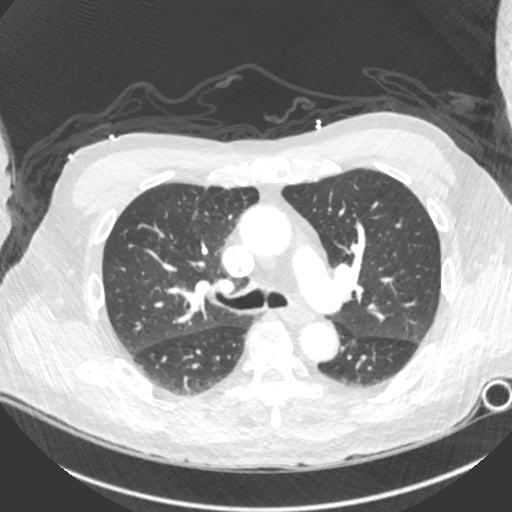
[im 276/423  soft-tissue]
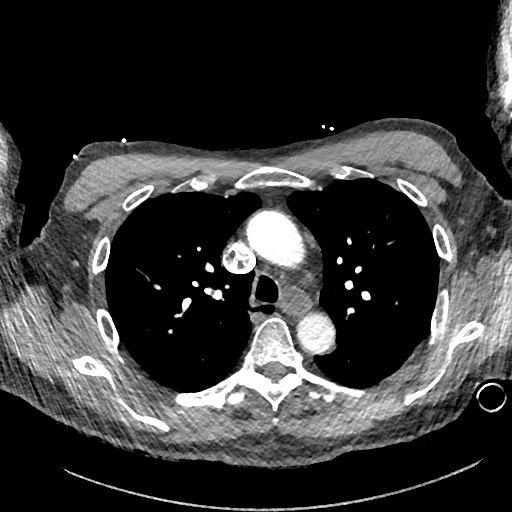
[im 312/423  lung]
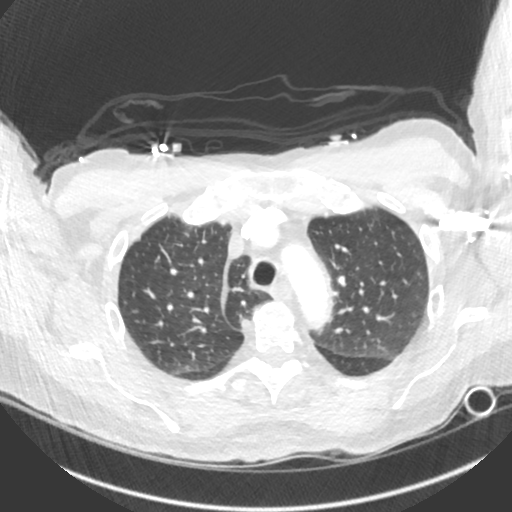
[im 349/423  soft-tissue]
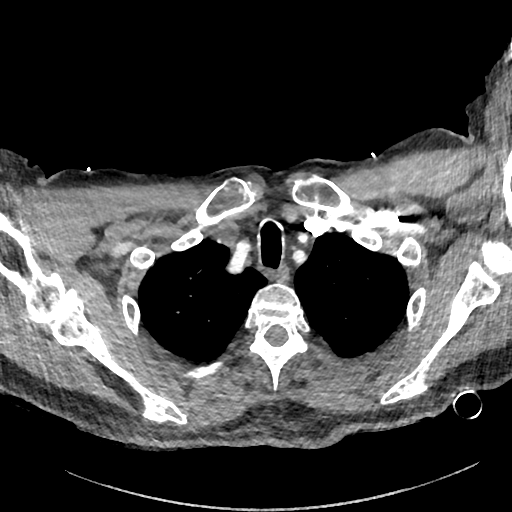
[im 367/423  lung]
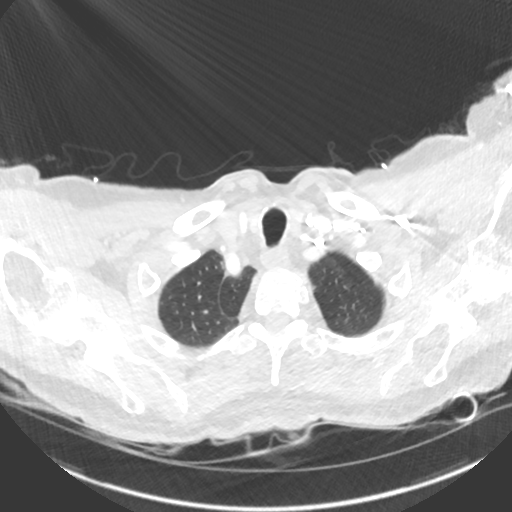
[im 404/423  soft-tissue]
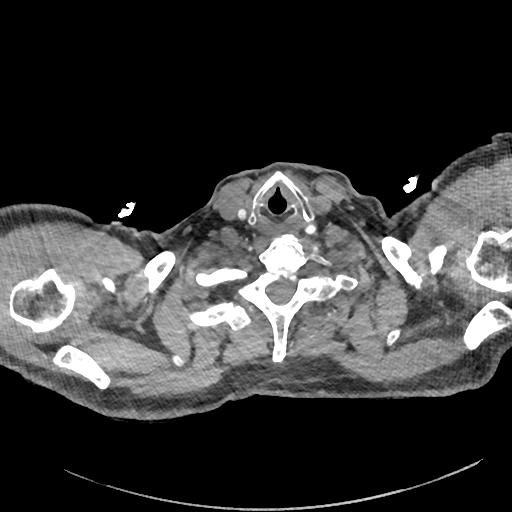

[Series 7: cor · coronal · 0.58mm/px · 3 of 134 slices shown]
[im 34/134  soft-tissue]
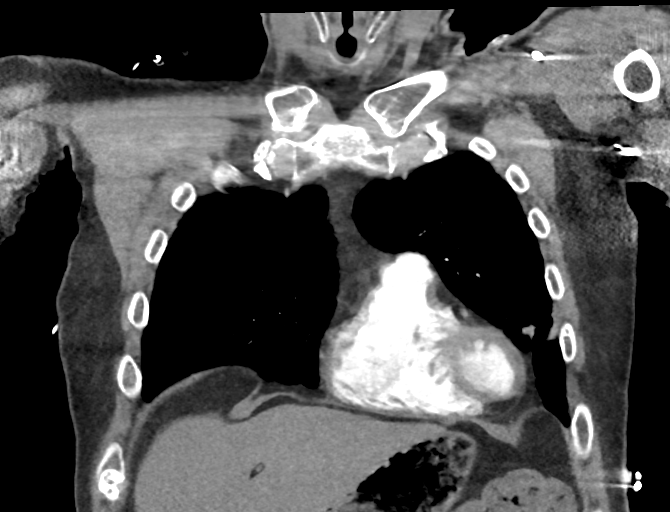
[im 67/134  soft-tissue]
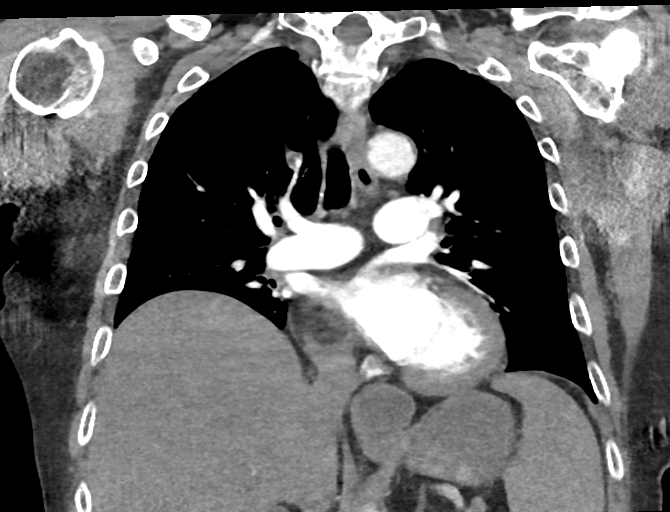
[im 100/134  soft-tissue]
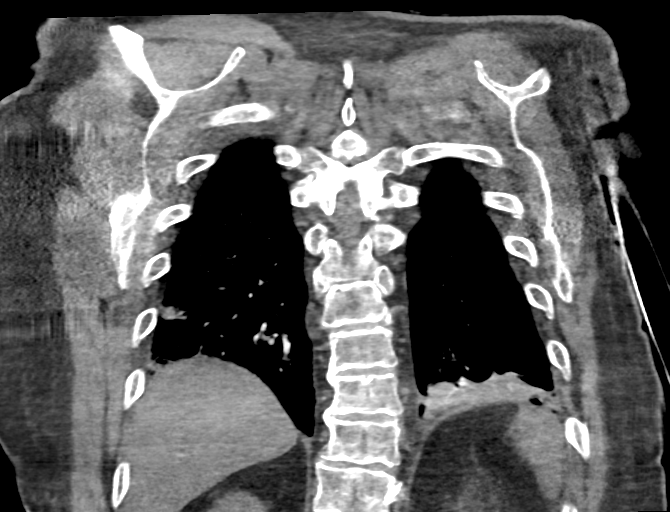

[17 of 46 positions shown; findings below may reference images not displayed]

RADIATION DOSE REDUCTION: This exam was performed according to the
departmental dose-optimization program which includes automated
exposure control, adjustment of the mA and/or kV according to
patient size and/or use of iterative reconstruction technique.

CONTRAST:  75mL OMNIPAQUE IOHEXOL 350 MG/ML SOLN
FINDINGS: Cardiovascular: There are no filling defects within the pulmonary
arterial tree to suggest pulmonary embolism. Heart size is normal.
There is no significant pericardial fluid, thickening or pericardial
calcification. There is aortic atherosclerosis, as well as
atherosclerosis of the great vessels of the mediastinum and the
coronary arteries, including calcified atherosclerotic plaque in the
left anterior descending and right coronary arteries.

Mediastinum/Nodes: No pathologically enlarged mediastinal or hilar
lymph nodes. Small to moderate hiatal hernia. No axillary
lymphadenopathy.

Lungs/Pleura: Patchy areas of ground-glass attenuation, thickening
of the peribronchovascular interstitium and peribronchovascular
airspace consolidation noted in the lung bases bilaterally, most
severe in the left lower lobe where there is extensive opacification
of bronchi, and some cylindrical bronchiectasis. No pleural
effusions. Azygous lobe (normal anatomical variant) incidentally
noted.

Upper Abdomen: Unremarkable.

Musculoskeletal: There are no aggressive appearing lytic or blastic
lesions noted in the visualized portions of the skeleton.

Review of the MIP images confirms the above findings.
IMPRESSION: 1. No evidence of pulmonary embolism.
2. Multilobar pneumonia, as above. Given the basilar and dependent
distribution, along with the opacification of multiple bronchi and
areas of bronchiectasis, clinical correlation for signs and symptoms
of recurrent aspiration is recommended. Speech therapy swallow study
should be considered.
3. Aortic atherosclerosis, in addition to two-vessel coronary artery
disease.
4. Small to moderate-sized hiatal hernia.

Aortic Atherosclerosis ([id number]).

## 2023-04-24 ENCOUNTER — Ambulatory Visit
Admission: RE | Admit: 2023-04-24 | Discharge: 2023-04-24 | Disposition: A | Discharge status: Home / Self Care | Payer: Medicare Other | Source: Ambulatory Visit | Attending: Family Medicine | Admitting: Family Medicine

## 2023-04-24 DIAGNOSIS — R918 Other nonspecific abnormal finding of lung field: Secondary | ICD-10-CM | POA: Insufficient documentation

## 2023-04-24 DIAGNOSIS — Z8619 Personal history of other infectious and parasitic diseases: Secondary | ICD-10-CM | POA: Diagnosis present
